# Patient Record
Sex: Female | Born: 1965
Health system: Southern US, Community
[De-identification: ages and names within clinical notes are randomized; demographics above are authoritative.]

## PROBLEM LIST (undated history)

## (undated) DIAGNOSIS — B9734 Human T-cell lymphotrophic virus, type II [HTLV-II] as the cause of diseases classified elsewhere: Secondary | ICD-10-CM

## (undated) DIAGNOSIS — B009 Herpesviral infection, unspecified: Secondary | ICD-10-CM

## (undated) DIAGNOSIS — R635 Abnormal weight gain: Secondary | ICD-10-CM

## (undated) DIAGNOSIS — S2239XA Fracture of one rib, unspecified side, initial encounter for closed fracture: Secondary | ICD-10-CM

## (undated) DIAGNOSIS — B20 Human immunodeficiency virus [HIV] disease: Secondary | ICD-10-CM

## (undated) DIAGNOSIS — R160 Hepatomegaly, not elsewhere classified: Secondary | ICD-10-CM

## (undated) DIAGNOSIS — Z972 Presence of dental prosthetic device (complete) (partial): Secondary | ICD-10-CM

## (undated) DIAGNOSIS — C4490 Unspecified malignant neoplasm of skin, unspecified: Secondary | ICD-10-CM

## (undated) HISTORY — DX: Abnormal weight gain: R63.5

## (undated) HISTORY — DX: Human immunodeficiency virus (HIV) disease: B20

## (undated) HISTORY — DX: Hepatomegaly, not elsewhere classified: R16.0

## (undated) HISTORY — PX: UPPER GI ENDOSCOPY: SHX6162

## (undated) HISTORY — DX: Unspecified malignant neoplasm of skin, unspecified: C44.90

## (undated) HISTORY — PX: COLONOSCOPY: SHX174

## (undated) HISTORY — DX: Human T-cell lymphotrophic virus, type II (HTLV-II) as the cause of diseases classified elsewhere: B97.34

---

## 1989-12-03 ENCOUNTER — Encounter (INDEPENDENT_AMBULATORY_CARE_PROVIDER_SITE_OTHER): Payer: Self-pay | Admitting: *Deleted

## 1989-12-03 LAB — CONVERTED CEMR LAB
CD4 Count: 396 microliters
CD4 T Cell Abs: 396

## 1998-03-04 ENCOUNTER — Encounter: Admission: RE | Admit: 1998-03-04 | Discharge: 1998-03-04 | Payer: Self-pay | Admitting: Infectious Diseases

## 1998-03-11 ENCOUNTER — Encounter: Admission: RE | Admit: 1998-03-11 | Discharge: 1998-03-11 | Payer: Self-pay | Admitting: Infectious Diseases

## 1998-04-25 ENCOUNTER — Encounter: Admission: RE | Admit: 1998-04-25 | Discharge: 1998-04-25 | Payer: Self-pay | Admitting: Infectious Diseases

## 1998-05-13 ENCOUNTER — Encounter: Admission: RE | Admit: 1998-05-13 | Discharge: 1998-05-13 | Payer: Self-pay | Admitting: Infectious Diseases

## 1998-06-11 ENCOUNTER — Encounter: Admission: RE | Admit: 1998-06-11 | Discharge: 1998-06-11 | Payer: Self-pay | Admitting: Infectious Diseases

## 1998-06-24 ENCOUNTER — Encounter: Admission: RE | Admit: 1998-06-24 | Discharge: 1998-06-24 | Payer: Self-pay | Admitting: Infectious Diseases

## 1998-08-05 ENCOUNTER — Ambulatory Visit (HOSPITAL_COMMUNITY): Admission: RE | Admit: 1998-08-05 | Discharge: 1998-08-05 | Payer: Self-pay | Admitting: Infectious Diseases

## 1998-08-05 ENCOUNTER — Encounter: Admission: RE | Admit: 1998-08-05 | Discharge: 1998-08-05 | Payer: Self-pay | Admitting: Infectious Diseases

## 1998-08-26 ENCOUNTER — Ambulatory Visit (HOSPITAL_COMMUNITY): Admission: RE | Admit: 1998-08-26 | Discharge: 1998-08-26 | Payer: Self-pay | Admitting: Infectious Diseases

## 1998-08-26 ENCOUNTER — Encounter: Payer: Self-pay | Admitting: Infectious Diseases

## 1998-08-26 ENCOUNTER — Encounter: Admission: RE | Admit: 1998-08-26 | Discharge: 1998-08-26 | Payer: Self-pay | Admitting: Infectious Diseases

## 1998-09-19 ENCOUNTER — Encounter: Admission: RE | Admit: 1998-09-19 | Discharge: 1998-09-19 | Payer: Self-pay | Admitting: Infectious Diseases

## 1998-10-09 ENCOUNTER — Encounter: Admission: RE | Admit: 1998-10-09 | Discharge: 1998-10-09 | Payer: Self-pay | Admitting: Infectious Diseases

## 1998-10-21 ENCOUNTER — Ambulatory Visit (HOSPITAL_COMMUNITY): Admission: RE | Admit: 1998-10-21 | Discharge: 1998-10-21 | Payer: Self-pay | Admitting: Pulmonary Disease

## 1998-10-21 ENCOUNTER — Encounter: Payer: Self-pay | Admitting: Pulmonary Disease

## 1998-11-14 ENCOUNTER — Emergency Department (HOSPITAL_COMMUNITY): Admission: EM | Admit: 1998-11-14 | Discharge: 1998-11-14 | Payer: Self-pay | Admitting: Emergency Medicine

## 1998-11-15 ENCOUNTER — Encounter: Payer: Self-pay | Admitting: Emergency Medicine

## 1998-11-16 ENCOUNTER — Inpatient Hospital Stay (HOSPITAL_COMMUNITY): Admission: AD | Admit: 1998-11-16 | Discharge: 1998-11-16 | Payer: Self-pay | Admitting: *Deleted

## 1998-11-18 ENCOUNTER — Emergency Department (HOSPITAL_COMMUNITY): Admission: EM | Admit: 1998-11-18 | Discharge: 1998-11-18 | Payer: Self-pay | Admitting: Emergency Medicine

## 1998-11-20 ENCOUNTER — Encounter: Admission: RE | Admit: 1998-11-20 | Discharge: 1998-11-20 | Payer: Self-pay | Admitting: Infectious Diseases

## 1999-01-20 ENCOUNTER — Encounter: Admission: RE | Admit: 1999-01-20 | Discharge: 1999-01-20 | Payer: Self-pay | Admitting: Infectious Diseases

## 1999-01-20 ENCOUNTER — Ambulatory Visit (HOSPITAL_COMMUNITY): Admission: RE | Admit: 1999-01-20 | Discharge: 1999-01-20 | Payer: Self-pay | Admitting: Infectious Diseases

## 1999-04-14 ENCOUNTER — Encounter: Admission: RE | Admit: 1999-04-14 | Discharge: 1999-04-14 | Payer: Self-pay | Admitting: Infectious Diseases

## 1999-04-14 ENCOUNTER — Ambulatory Visit (HOSPITAL_COMMUNITY): Admission: RE | Admit: 1999-04-14 | Discharge: 1999-04-14 | Payer: Self-pay | Admitting: Infectious Diseases

## 1999-05-14 ENCOUNTER — Encounter: Admission: RE | Admit: 1999-05-14 | Discharge: 1999-05-14 | Payer: Self-pay | Admitting: Infectious Diseases

## 1999-07-14 ENCOUNTER — Encounter: Admission: RE | Admit: 1999-07-14 | Discharge: 1999-07-14 | Payer: Self-pay | Admitting: Internal Medicine

## 1999-08-18 ENCOUNTER — Ambulatory Visit (HOSPITAL_COMMUNITY): Admission: RE | Admit: 1999-08-18 | Discharge: 1999-08-18 | Payer: Self-pay | Admitting: Internal Medicine

## 1999-08-18 ENCOUNTER — Encounter: Admission: RE | Admit: 1999-08-18 | Discharge: 1999-08-18 | Payer: Self-pay | Admitting: Internal Medicine

## 1999-10-01 ENCOUNTER — Encounter: Admission: RE | Admit: 1999-10-01 | Discharge: 1999-10-01 | Payer: Self-pay | Admitting: Infectious Diseases

## 1999-10-07 ENCOUNTER — Encounter: Payer: Self-pay | Admitting: Infectious Diseases

## 1999-10-07 ENCOUNTER — Ambulatory Visit (HOSPITAL_COMMUNITY): Admission: RE | Admit: 1999-10-07 | Discharge: 1999-10-07 | Payer: Self-pay | Admitting: Infectious Diseases

## 1999-11-12 ENCOUNTER — Ambulatory Visit (HOSPITAL_COMMUNITY): Admission: RE | Admit: 1999-11-12 | Discharge: 1999-11-12 | Payer: Self-pay | Admitting: Infectious Diseases

## 1999-11-12 ENCOUNTER — Encounter: Admission: RE | Admit: 1999-11-12 | Discharge: 1999-11-12 | Payer: Self-pay | Admitting: Infectious Diseases

## 1999-12-01 ENCOUNTER — Encounter: Admission: RE | Admit: 1999-12-01 | Discharge: 1999-12-01 | Payer: Self-pay | Admitting: Infectious Diseases

## 1999-12-02 ENCOUNTER — Encounter: Admission: RE | Admit: 1999-12-02 | Discharge: 1999-12-02 | Payer: Self-pay | Admitting: Obstetrics & Gynecology

## 2000-01-27 ENCOUNTER — Encounter: Admission: RE | Admit: 2000-01-27 | Discharge: 2000-01-27 | Payer: Self-pay | Admitting: Obstetrics & Gynecology

## 2000-01-28 ENCOUNTER — Encounter: Admission: RE | Admit: 2000-01-28 | Discharge: 2000-01-28 | Payer: Self-pay | Admitting: Infectious Diseases

## 2000-01-28 ENCOUNTER — Ambulatory Visit (HOSPITAL_COMMUNITY): Admission: RE | Admit: 2000-01-28 | Discharge: 2000-01-28 | Payer: Self-pay | Admitting: Infectious Diseases

## 2000-02-09 ENCOUNTER — Encounter: Admission: RE | Admit: 2000-02-09 | Discharge: 2000-02-09 | Payer: Self-pay | Admitting: Infectious Diseases

## 2000-03-31 ENCOUNTER — Encounter: Admission: RE | Admit: 2000-03-31 | Discharge: 2000-03-31 | Payer: Self-pay | Admitting: Infectious Diseases

## 2000-04-17 ENCOUNTER — Inpatient Hospital Stay (HOSPITAL_COMMUNITY): Admission: EM | Admit: 2000-04-17 | Discharge: 2000-04-20 | Payer: Self-pay

## 2000-04-17 ENCOUNTER — Encounter: Payer: Self-pay | Admitting: Surgery

## 2000-04-17 ENCOUNTER — Encounter: Payer: Self-pay | Admitting: Emergency Medicine

## 2000-04-18 ENCOUNTER — Encounter: Payer: Self-pay | Admitting: Surgery

## 2000-06-29 ENCOUNTER — Encounter: Admission: RE | Admit: 2000-06-29 | Discharge: 2000-06-29 | Payer: Self-pay | Admitting: Infectious Diseases

## 2000-06-29 ENCOUNTER — Ambulatory Visit (HOSPITAL_COMMUNITY): Admission: RE | Admit: 2000-06-29 | Discharge: 2000-06-29 | Payer: Self-pay | Admitting: Infectious Diseases

## 2000-07-07 ENCOUNTER — Encounter: Admission: RE | Admit: 2000-07-07 | Discharge: 2000-07-07 | Payer: Self-pay | Admitting: Infectious Diseases

## 2000-08-18 ENCOUNTER — Encounter: Admission: RE | Admit: 2000-08-18 | Discharge: 2000-08-18 | Payer: Self-pay | Admitting: Infectious Diseases

## 2000-08-18 ENCOUNTER — Ambulatory Visit (HOSPITAL_COMMUNITY): Admission: RE | Admit: 2000-08-18 | Discharge: 2000-08-18 | Payer: Self-pay | Admitting: Infectious Diseases

## 2000-09-06 ENCOUNTER — Encounter: Admission: RE | Admit: 2000-09-06 | Discharge: 2000-09-06 | Payer: Self-pay | Admitting: Infectious Diseases

## 2000-10-15 ENCOUNTER — Ambulatory Visit (HOSPITAL_COMMUNITY): Admission: RE | Admit: 2000-10-15 | Discharge: 2000-10-15 | Payer: Self-pay | Admitting: Surgery

## 2000-10-15 ENCOUNTER — Encounter (INDEPENDENT_AMBULATORY_CARE_PROVIDER_SITE_OTHER): Payer: Self-pay | Admitting: Specialist

## 2001-05-02 ENCOUNTER — Encounter: Admission: RE | Admit: 2001-05-02 | Discharge: 2001-05-02 | Payer: Self-pay | Admitting: Infectious Diseases

## 2001-05-16 ENCOUNTER — Encounter: Admission: RE | Admit: 2001-05-16 | Discharge: 2001-05-16 | Payer: Self-pay | Admitting: Infectious Diseases

## 2001-08-09 ENCOUNTER — Encounter: Admission: RE | Admit: 2001-08-09 | Discharge: 2001-08-09 | Payer: Self-pay | Admitting: Infectious Diseases

## 2001-08-09 ENCOUNTER — Ambulatory Visit (HOSPITAL_COMMUNITY): Admission: RE | Admit: 2001-08-09 | Discharge: 2001-08-09 | Payer: Self-pay | Admitting: Infectious Diseases

## 2001-08-29 ENCOUNTER — Encounter: Admission: RE | Admit: 2001-08-29 | Discharge: 2001-08-29 | Payer: Self-pay | Admitting: Infectious Diseases

## 2001-11-16 ENCOUNTER — Encounter (INDEPENDENT_AMBULATORY_CARE_PROVIDER_SITE_OTHER): Payer: Self-pay | Admitting: *Deleted

## 2001-11-16 ENCOUNTER — Ambulatory Visit (HOSPITAL_BASED_OUTPATIENT_CLINIC_OR_DEPARTMENT_OTHER): Admission: RE | Admit: 2001-11-16 | Discharge: 2001-11-16 | Payer: Self-pay | Admitting: Plastic Surgery

## 2002-01-23 ENCOUNTER — Encounter: Admission: RE | Admit: 2002-01-23 | Discharge: 2002-01-23 | Payer: Self-pay | Admitting: Infectious Diseases

## 2002-01-23 ENCOUNTER — Ambulatory Visit (HOSPITAL_COMMUNITY): Admission: RE | Admit: 2002-01-23 | Discharge: 2002-01-23 | Payer: Self-pay | Admitting: Infectious Diseases

## 2002-06-07 ENCOUNTER — Encounter: Payer: Self-pay | Admitting: Infectious Diseases

## 2002-06-07 ENCOUNTER — Ambulatory Visit (HOSPITAL_COMMUNITY): Admission: RE | Admit: 2002-06-07 | Discharge: 2002-06-07 | Payer: Self-pay | Admitting: Infectious Diseases

## 2002-07-24 ENCOUNTER — Ambulatory Visit (HOSPITAL_COMMUNITY): Admission: RE | Admit: 2002-07-24 | Discharge: 2002-07-24 | Payer: Self-pay | Admitting: Infectious Diseases

## 2002-07-24 ENCOUNTER — Encounter: Admission: RE | Admit: 2002-07-24 | Discharge: 2002-07-24 | Payer: Self-pay | Admitting: Infectious Diseases

## 2002-08-28 ENCOUNTER — Encounter: Admission: RE | Admit: 2002-08-28 | Discharge: 2002-08-28 | Payer: Self-pay | Admitting: Infectious Diseases

## 2002-09-08 ENCOUNTER — Encounter: Payer: Self-pay | Admitting: Infectious Diseases

## 2002-09-08 ENCOUNTER — Ambulatory Visit (HOSPITAL_COMMUNITY): Admission: RE | Admit: 2002-09-08 | Discharge: 2002-09-08 | Payer: Self-pay | Admitting: Infectious Diseases

## 2002-10-23 ENCOUNTER — Encounter: Admission: RE | Admit: 2002-10-23 | Discharge: 2002-10-23 | Payer: Self-pay | Admitting: Infectious Diseases

## 2002-11-15 ENCOUNTER — Ambulatory Visit (HOSPITAL_COMMUNITY): Admission: RE | Admit: 2002-11-15 | Discharge: 2002-11-15 | Payer: Self-pay | Admitting: Infectious Diseases

## 2002-11-15 ENCOUNTER — Encounter: Admission: RE | Admit: 2002-11-15 | Discharge: 2002-11-15 | Payer: Self-pay | Admitting: Infectious Diseases

## 2002-12-04 ENCOUNTER — Encounter: Admission: RE | Admit: 2002-12-04 | Discharge: 2002-12-04 | Payer: Self-pay | Admitting: Infectious Diseases

## 2002-12-21 ENCOUNTER — Encounter: Admission: RE | Admit: 2002-12-21 | Discharge: 2002-12-21 | Payer: Self-pay | Admitting: Infectious Diseases

## 2002-12-22 ENCOUNTER — Encounter: Admission: RE | Admit: 2002-12-22 | Discharge: 2002-12-22 | Payer: Self-pay | Admitting: Infectious Diseases

## 2003-01-12 ENCOUNTER — Ambulatory Visit (HOSPITAL_COMMUNITY): Admission: RE | Admit: 2003-01-12 | Discharge: 2003-01-12 | Payer: Self-pay | Admitting: Infectious Diseases

## 2003-01-12 ENCOUNTER — Encounter: Payer: Self-pay | Admitting: Infectious Diseases

## 2003-01-15 ENCOUNTER — Encounter: Admission: RE | Admit: 2003-01-15 | Discharge: 2003-01-15 | Payer: Self-pay | Admitting: Infectious Diseases

## 2003-01-16 ENCOUNTER — Encounter: Payer: Self-pay | Admitting: Infectious Diseases

## 2003-01-16 ENCOUNTER — Ambulatory Visit (HOSPITAL_COMMUNITY): Admission: RE | Admit: 2003-01-16 | Discharge: 2003-01-16 | Payer: Self-pay | Admitting: Infectious Diseases

## 2003-02-23 ENCOUNTER — Encounter: Admission: RE | Admit: 2003-02-23 | Discharge: 2003-02-23 | Payer: Self-pay | Admitting: Infectious Diseases

## 2003-02-23 ENCOUNTER — Encounter: Payer: Self-pay | Admitting: Infectious Diseases

## 2003-02-23 ENCOUNTER — Encounter (INDEPENDENT_AMBULATORY_CARE_PROVIDER_SITE_OTHER): Payer: Self-pay | Admitting: Infectious Diseases

## 2003-03-12 ENCOUNTER — Encounter: Admission: RE | Admit: 2003-03-12 | Discharge: 2003-03-12 | Payer: Self-pay | Admitting: Infectious Diseases

## 2003-05-28 ENCOUNTER — Encounter: Admission: RE | Admit: 2003-05-28 | Discharge: 2003-05-28 | Payer: Self-pay | Admitting: Internal Medicine

## 2003-05-28 ENCOUNTER — Ambulatory Visit (HOSPITAL_COMMUNITY): Admission: RE | Admit: 2003-05-28 | Discharge: 2003-05-28 | Payer: Self-pay | Admitting: Infectious Diseases

## 2003-05-28 ENCOUNTER — Encounter (INDEPENDENT_AMBULATORY_CARE_PROVIDER_SITE_OTHER): Payer: Self-pay | Admitting: Infectious Diseases

## 2003-06-11 ENCOUNTER — Encounter: Admission: RE | Admit: 2003-06-11 | Discharge: 2003-06-11 | Payer: Self-pay | Admitting: Infectious Diseases

## 2003-10-12 ENCOUNTER — Ambulatory Visit (HOSPITAL_COMMUNITY): Admission: RE | Admit: 2003-10-12 | Discharge: 2003-10-12 | Payer: Self-pay | Admitting: Internal Medicine

## 2003-10-16 ENCOUNTER — Encounter (INDEPENDENT_AMBULATORY_CARE_PROVIDER_SITE_OTHER): Payer: Self-pay | Admitting: Infectious Diseases

## 2003-10-16 ENCOUNTER — Ambulatory Visit (HOSPITAL_COMMUNITY): Admission: RE | Admit: 2003-10-16 | Discharge: 2003-10-16 | Payer: Self-pay | Admitting: Infectious Diseases

## 2003-10-16 ENCOUNTER — Encounter: Admission: RE | Admit: 2003-10-16 | Discharge: 2003-10-16 | Payer: Self-pay | Admitting: Internal Medicine

## 2003-12-25 ENCOUNTER — Other Ambulatory Visit: Admission: RE | Admit: 2003-12-25 | Discharge: 2003-12-25 | Payer: Self-pay | Admitting: Obstetrics and Gynecology

## 2003-12-27 ENCOUNTER — Encounter: Admission: RE | Admit: 2003-12-27 | Discharge: 2003-12-27 | Payer: Self-pay | Admitting: Otolaryngology

## 2004-01-14 ENCOUNTER — Ambulatory Visit (HOSPITAL_COMMUNITY): Admission: RE | Admit: 2004-01-14 | Discharge: 2004-01-14 | Payer: Self-pay | Admitting: Infectious Diseases

## 2004-01-14 ENCOUNTER — Encounter: Admission: RE | Admit: 2004-01-14 | Discharge: 2004-01-14 | Payer: Self-pay | Admitting: Infectious Diseases

## 2004-02-20 ENCOUNTER — Encounter: Admission: RE | Admit: 2004-02-20 | Discharge: 2004-02-20 | Payer: Self-pay | Admitting: Infectious Diseases

## 2004-03-04 ENCOUNTER — Encounter (INDEPENDENT_AMBULATORY_CARE_PROVIDER_SITE_OTHER): Payer: Self-pay | Admitting: Specialist

## 2004-03-04 ENCOUNTER — Observation Stay (HOSPITAL_COMMUNITY): Admission: RE | Admit: 2004-03-04 | Discharge: 2004-03-05 | Payer: Self-pay | Admitting: Obstetrics and Gynecology

## 2004-03-06 ENCOUNTER — Inpatient Hospital Stay (HOSPITAL_COMMUNITY): Admission: AD | Admit: 2004-03-06 | Discharge: 2004-03-10 | Payer: Self-pay | Admitting: Obstetrics and Gynecology

## 2004-03-17 ENCOUNTER — Ambulatory Visit (HOSPITAL_COMMUNITY): Admission: RE | Admit: 2004-03-17 | Discharge: 2004-03-17 | Payer: Self-pay | Admitting: Obstetrics and Gynecology

## 2004-04-22 ENCOUNTER — Ambulatory Visit (HOSPITAL_COMMUNITY): Admission: RE | Admit: 2004-04-22 | Discharge: 2004-04-22 | Payer: Self-pay | Admitting: Obstetrics and Gynecology

## 2005-01-12 ENCOUNTER — Encounter (INDEPENDENT_AMBULATORY_CARE_PROVIDER_SITE_OTHER): Payer: Self-pay | Admitting: *Deleted

## 2005-01-12 ENCOUNTER — Ambulatory Visit: Payer: Self-pay | Admitting: Infectious Diseases

## 2005-01-12 ENCOUNTER — Ambulatory Visit (HOSPITAL_COMMUNITY): Admission: RE | Admit: 2005-01-12 | Discharge: 2005-01-12 | Payer: Self-pay | Admitting: Infectious Diseases

## 2005-01-12 LAB — CONVERTED CEMR LAB: HIV 1 RNA Quant: 49 copies/mL

## 2005-01-30 ENCOUNTER — Ambulatory Visit: Payer: Self-pay | Admitting: Infectious Diseases

## 2005-02-25 ENCOUNTER — Ambulatory Visit: Payer: Self-pay | Admitting: Infectious Diseases

## 2005-03-19 IMAGING — US US PELVIS COMPLETE MODIFY
1 series · 14 of 25 positions shown · non-contrast
Comparison: none

CLINICAL DATA: Postoperative for a total vaginal hysterectomy.  Evaluate for pelvic hematoma.
TRANSABDOMINAL PELVIC ULTRASOUND:
Images of the pelvis were obtained both transabdominal and translabially.  Endovaginal assessment was not possible due to the patient?s recent total vaginal hysterectomy.
A normal vaginal cuff is identified both transabdominally and translabially.  Just superior to the vaginal cuff is a soft tissue density which measures 4.0 x 2.2 x 3.1 cm.  Because of the imaging used, the internal characteristics of this area cannot be well visualized, but there is increased through transmission suggesting that this has a fluid component and given the patient?s clinical situation likely represents a hematoma.
No free fluid is identified in the pelvis or with evaluation in the paracolic gutters.
IMPRESSION
Soft tissue abnormality directly superior to the vaginal cuff felt most likely to represent hematoma.  Please see above report for full discussion.

[Series 1: unknown · 0.32mm/px · 14 of 45 slices shown]
[im 1/45]
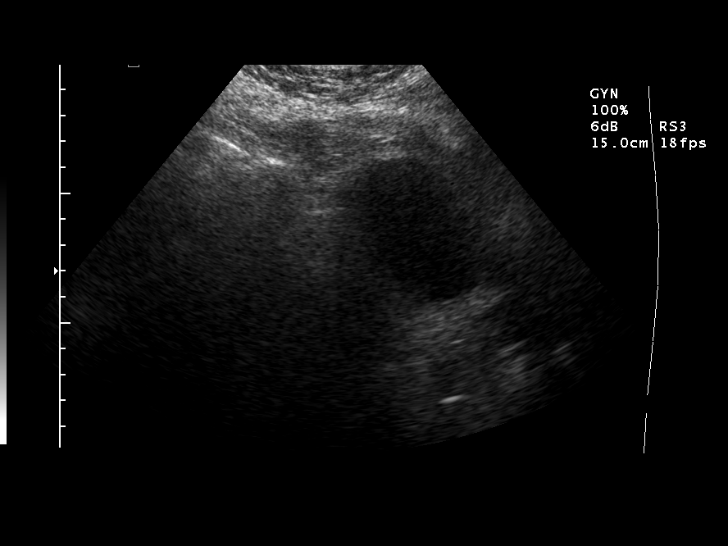
[im 4/45]
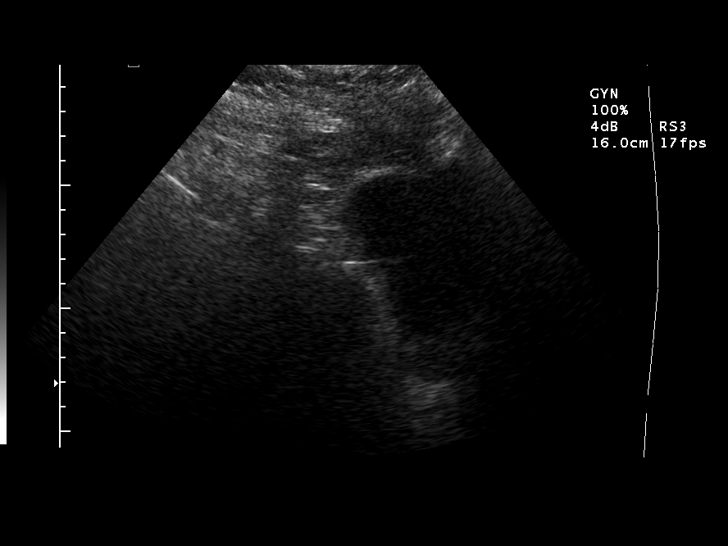
[im 8/45]
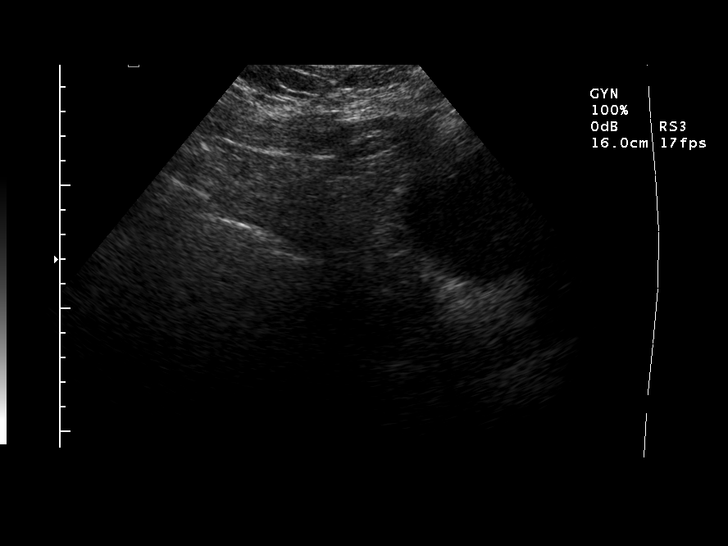
[im 12/45]
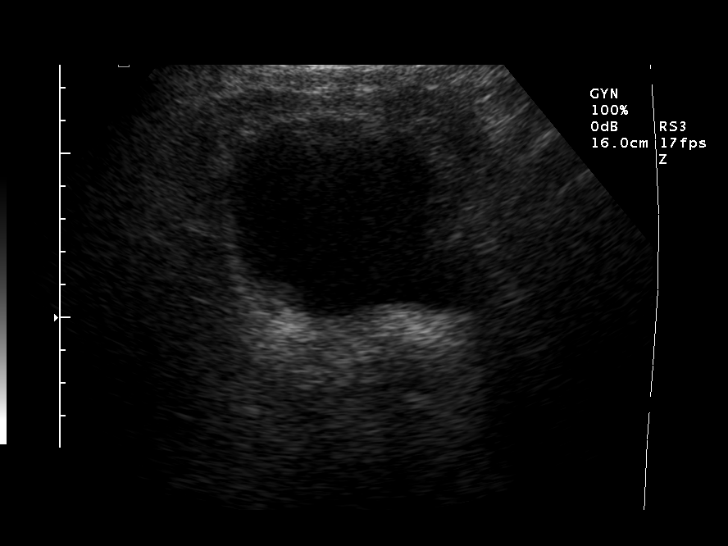
[im 15/45]
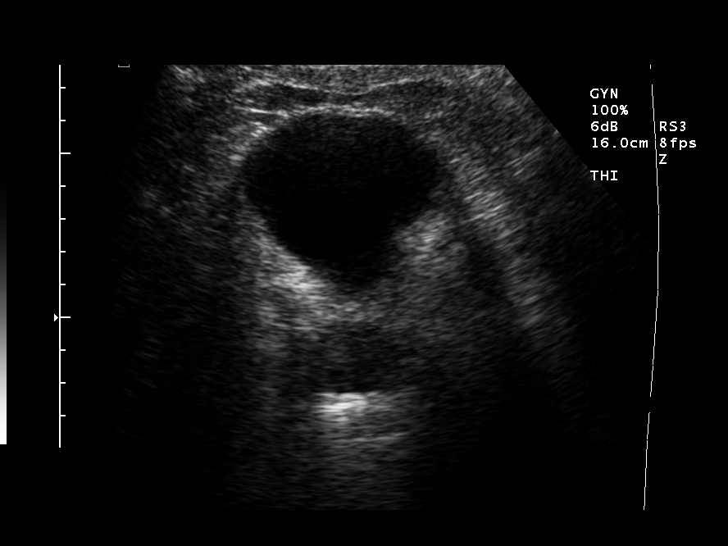
[im 17/45]
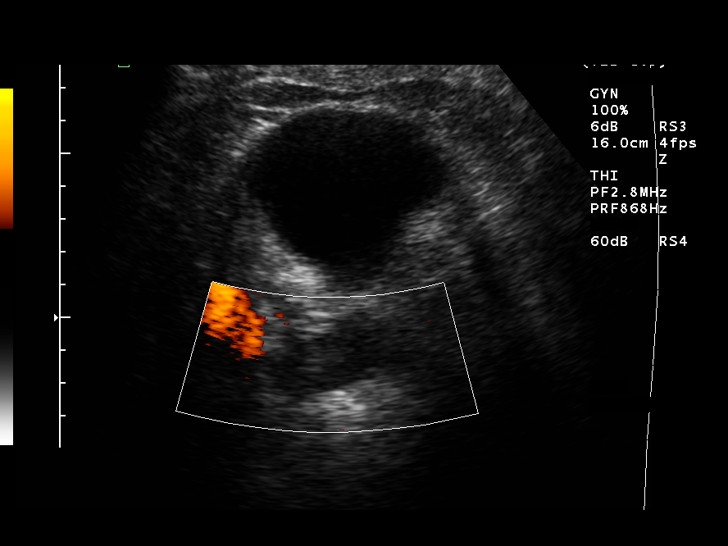
[im 21/45]
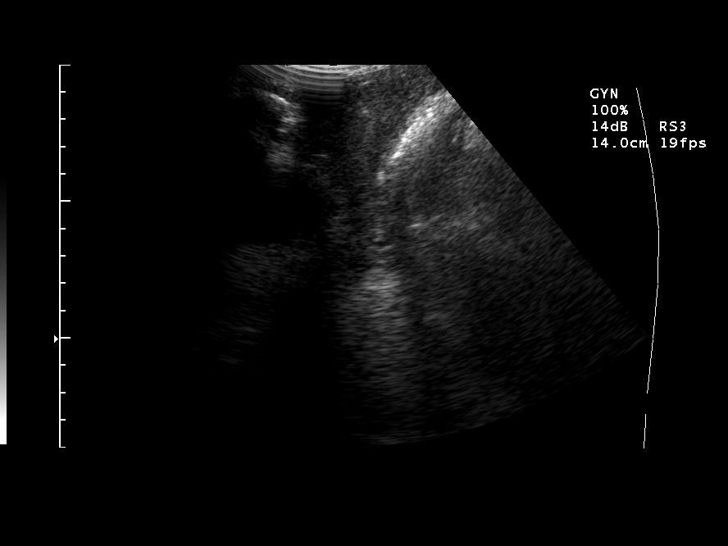
[im 24/45]
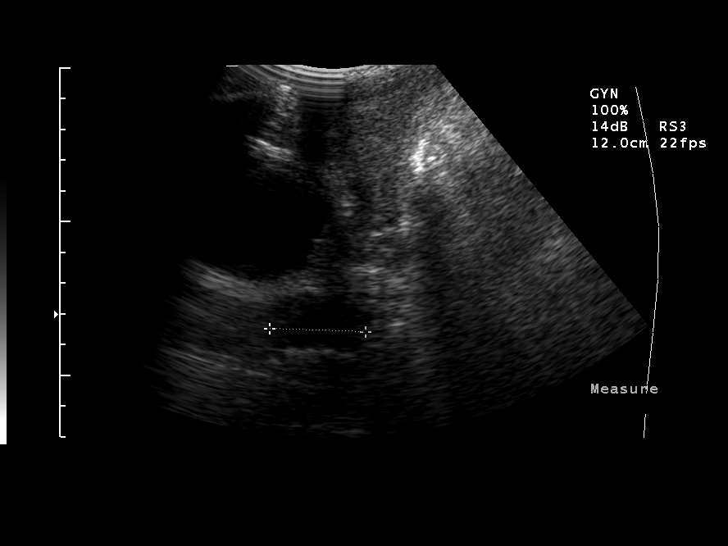
[im 28/45]
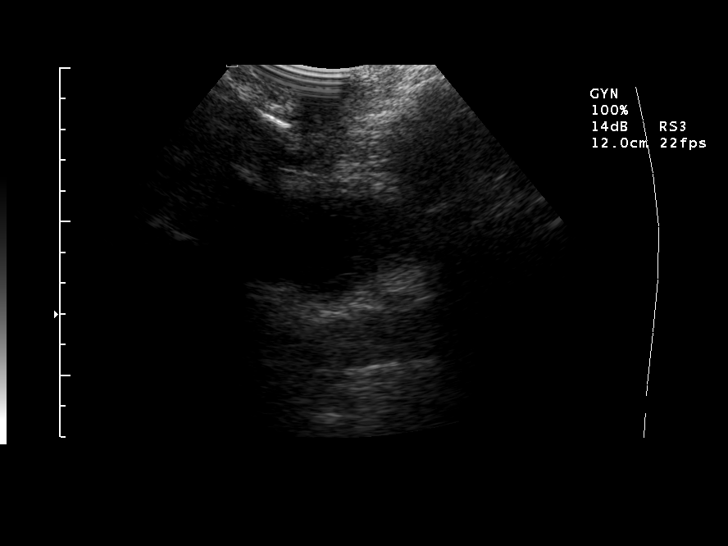
[im 30/45]
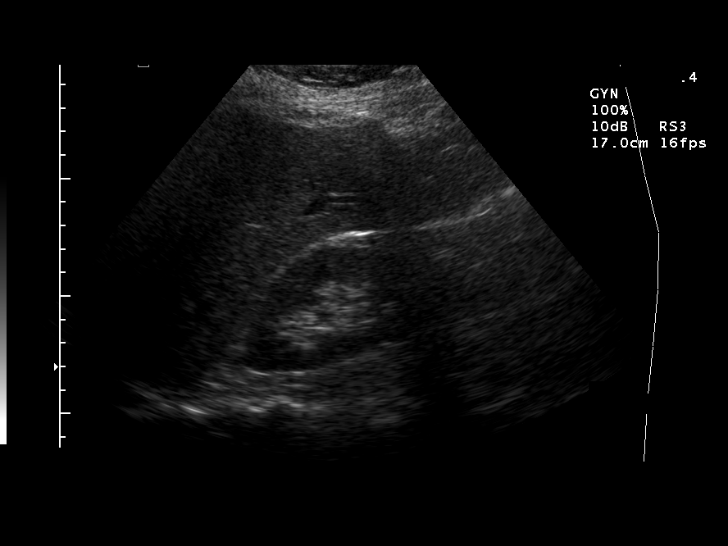
[im 34/45]
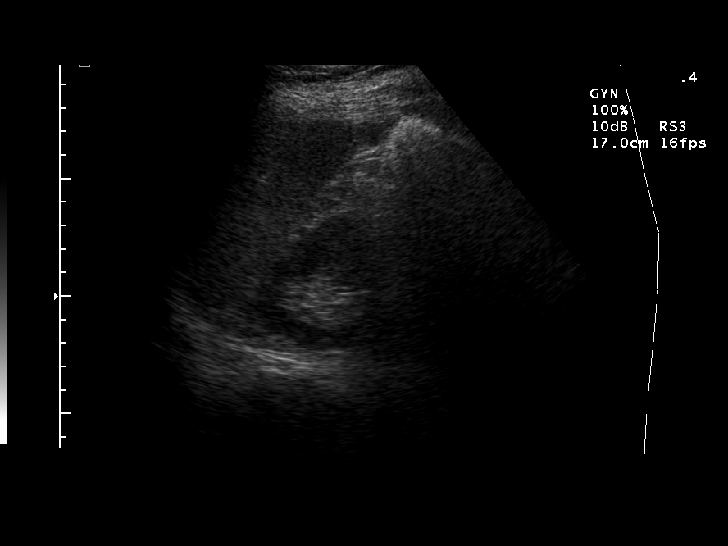
[im 37/45]
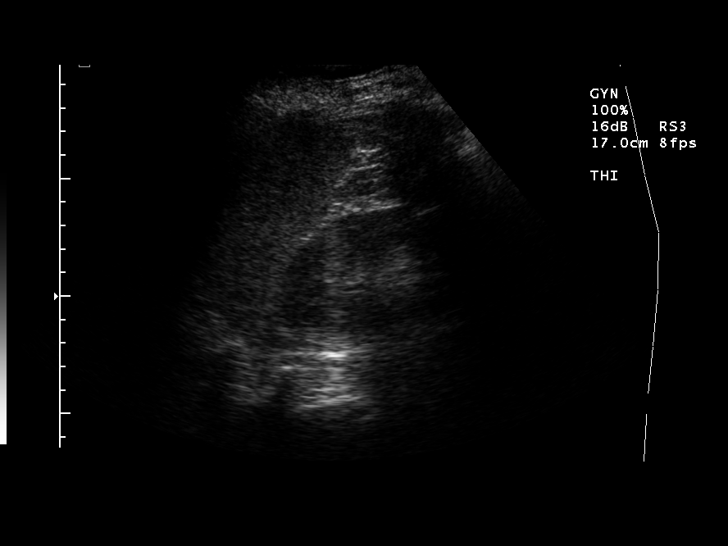
[im 41/45]
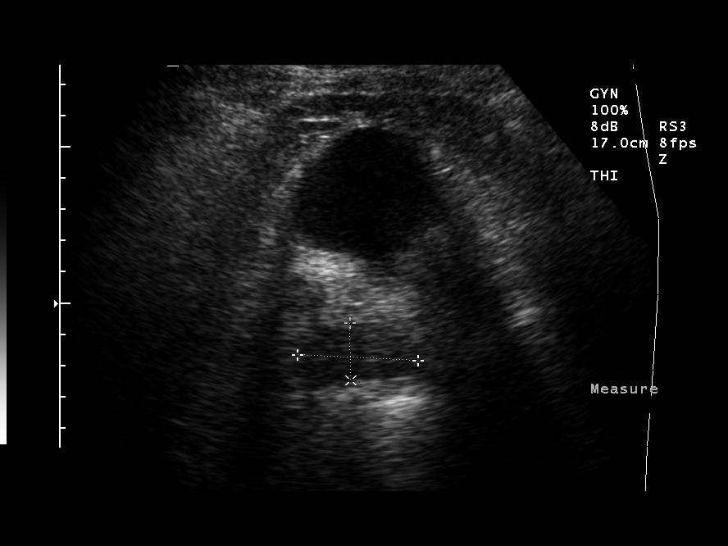
[im 45/45]
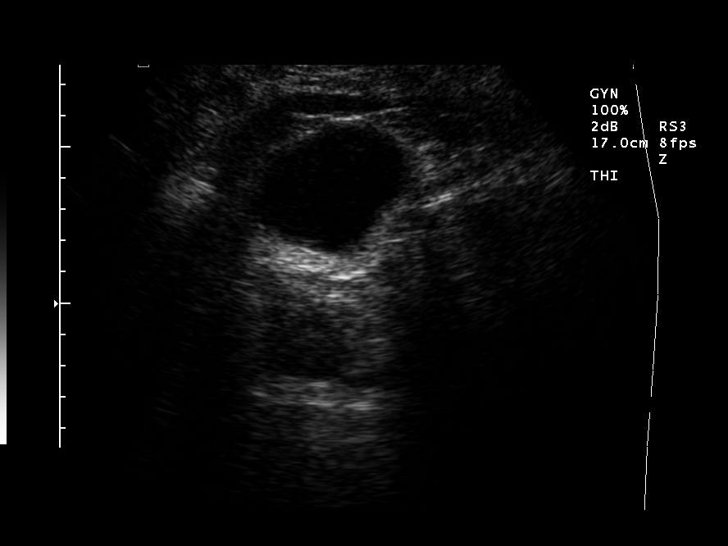

[14 of 25 positions shown; findings below may reference images not displayed]

## 2005-03-20 IMAGING — CT CT PELVIS W/ CM
1 of 2 series · 15 of 32 positions shown, 19 images · IV contrast ([ID] [id number]  & [ID] [id number])
Comparison: none

CLINICAL DATA: Postop hysterectomy.  Fever and pain.
TECHNIQUE: Multidetector, helical scanning through the abdomen and pelvis was performed following oral contrast administration and during infusion of 100 cc of Omnipaque 300, IV.
CT SCAN OF THE ABDOMEN WITH INTRAVENOUS CONTRAST:
There is bibasilar atelectasis and tiny right pleural effusion.
The liver, gallbladder, adrenal glands, spleen and pancreas are unremarkable in appearance.  There is no hydronephrosis.  In the lower pole of the right kidney there is an 8 mm low density lesion that is too small to characterize, but likely represents a cyst.  The kidneys are otherwise normal in appearance.  Visualized bowel loops have a normal appearance.
IMPRESSION
Negative CT scan of the abdomen.  Bibasilar atelectasis.
CT SCAN OF THE PELVIS WITH INTRAVENOUS CONTRAST:
There is subcutaneous air in the umbilical region, as well as the left lower pelvis consistent with the recent surgery.  There are also punctate areas of free intraperitoneal air within the pelvis.  This is consistent with the patient?s recent surgery.  The patient is status post hysterectomy.  In the pelvis there is increased high attenuation material which is likely complex fluid.  No air is seen with the fluid to suggest an abscess.  There is no enlarged adenopathy.  Visualized bowel loops have an unremarkable appearance.
Postoperative changes are noted in the pelvis with punctate areas of free intraperitoneal air and subcutaneous air.  
There is probable complex fluid in the pelvis.  This may represent some postoperative hematoma, but a plegmon collection cannot entirely be excluded.  There is no air within the complex fluid to suggest that this represents an abscess.

[Series 2: — · axial · 0.66mm/px · z∈[-432,-82]mm · 15 of 115 slices shown, 19 images]
[im 9/115  soft-tissue]
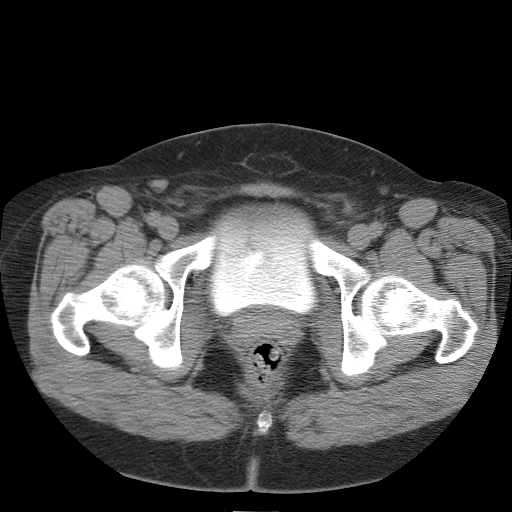
[im 9/115  bone]
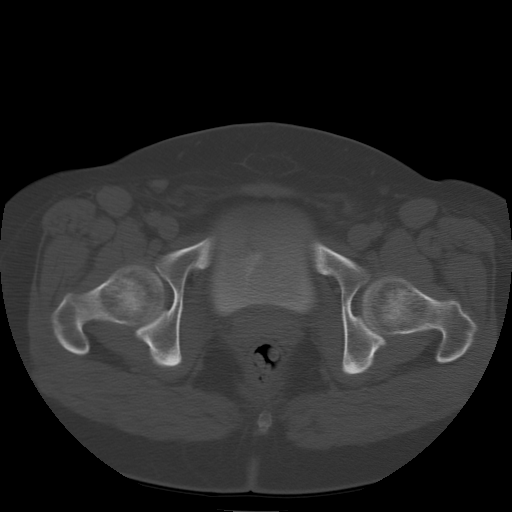
[im 17/115  soft-tissue]
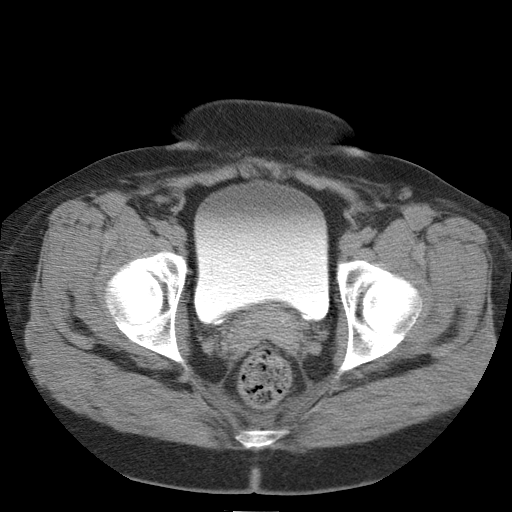
[im 26/115  soft-tissue]
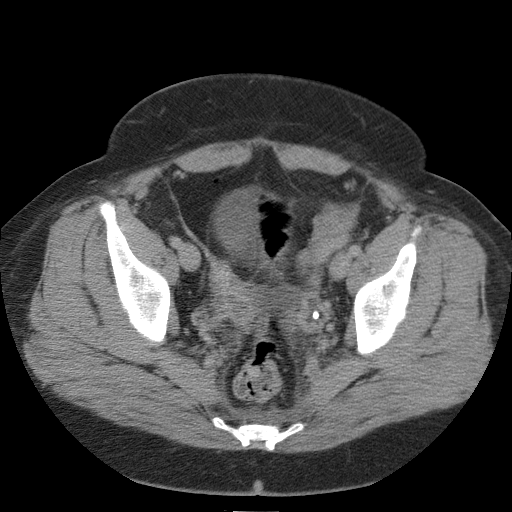
[im 34/115  soft-tissue]
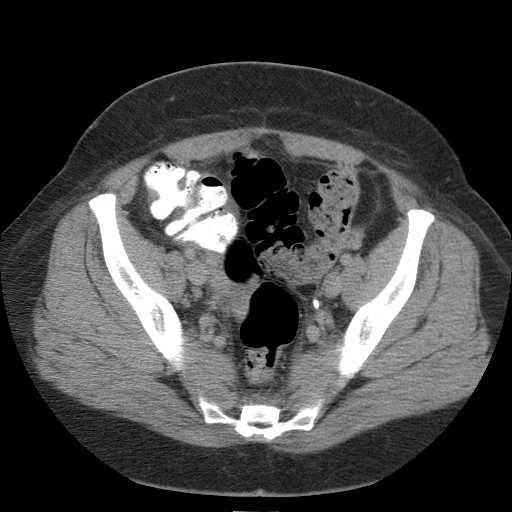
[im 43/115  soft-tissue]
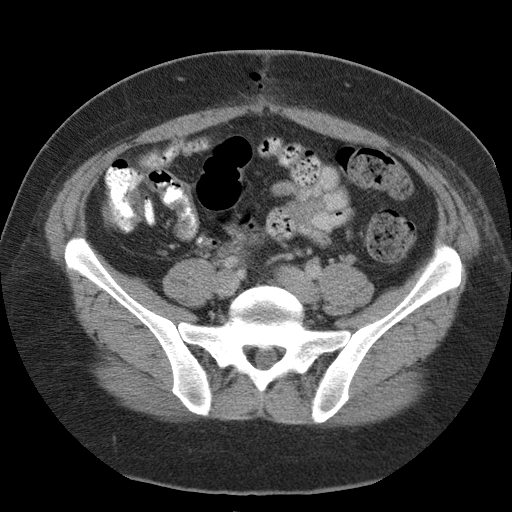
[im 51/115  soft-tissue]
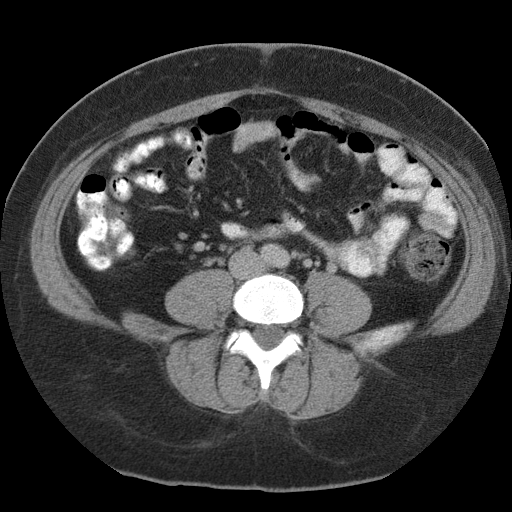
[im 60/115  soft-tissue]
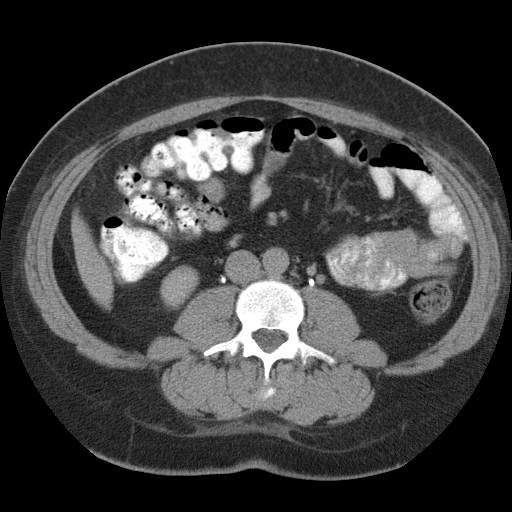
[im 68/115  soft-tissue]
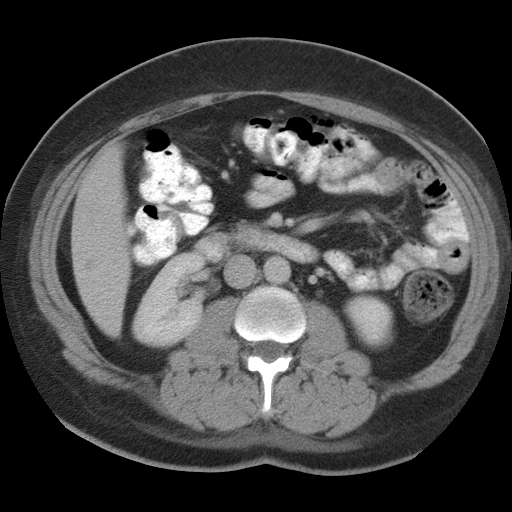
[im 77/115  soft-tissue]
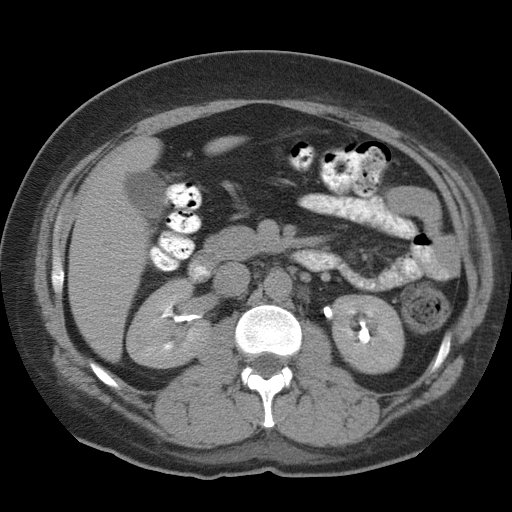
[im 77/115  bone]
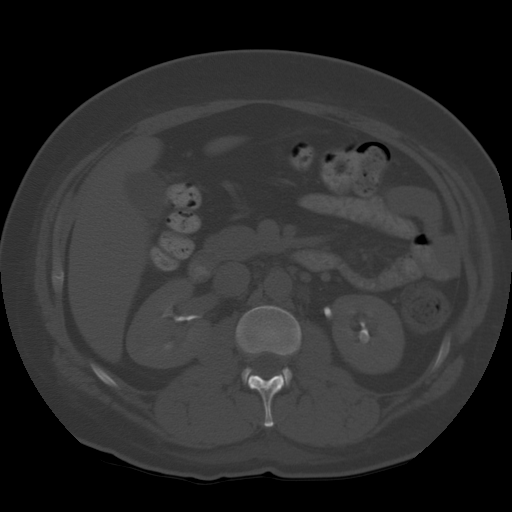
[im 85/115  soft-tissue]
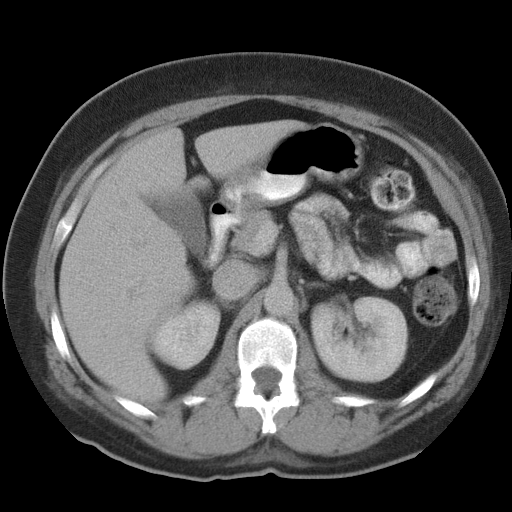
[im 93/115  soft-tissue]
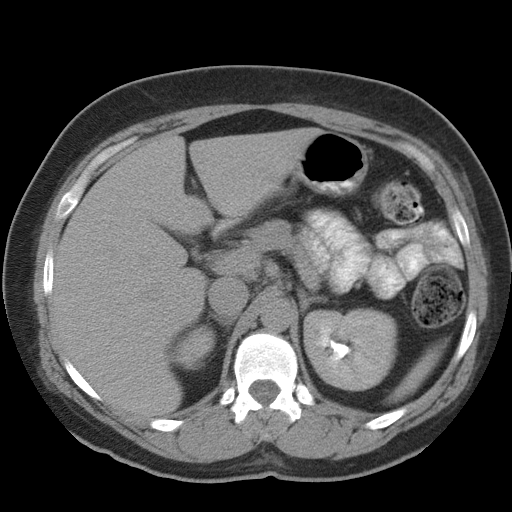
[im 98/115  lung]
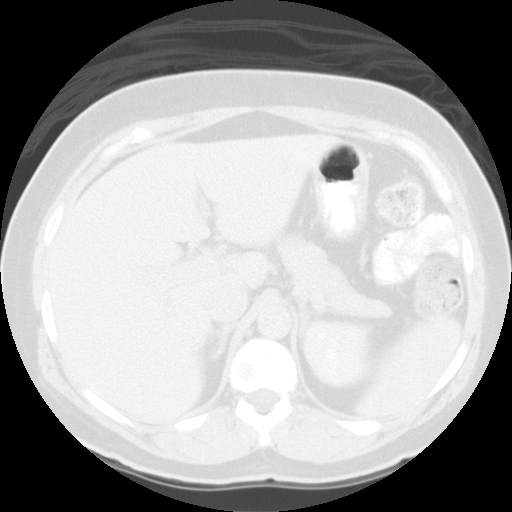
[im 102/115  soft-tissue]
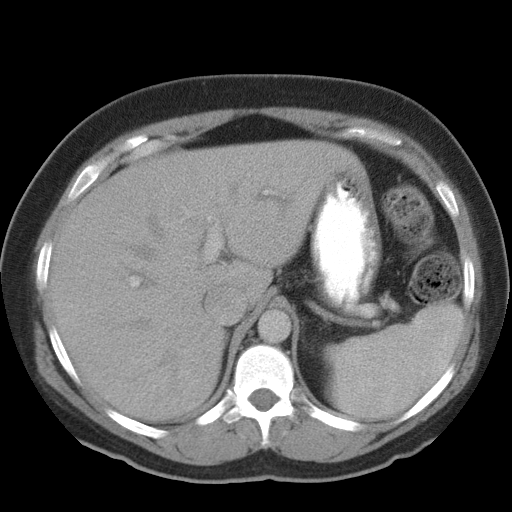
[im 102/115  lung]
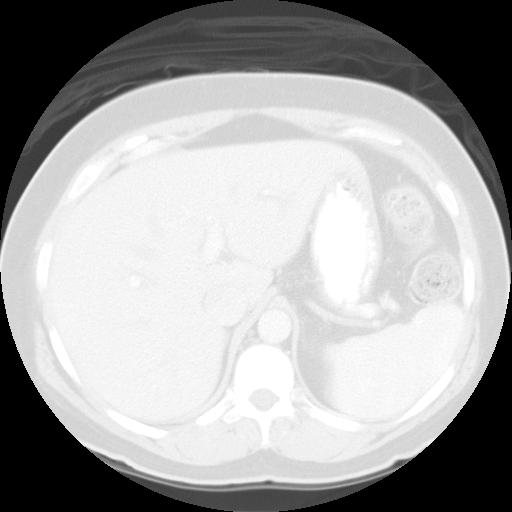
[im 106/115  lung]
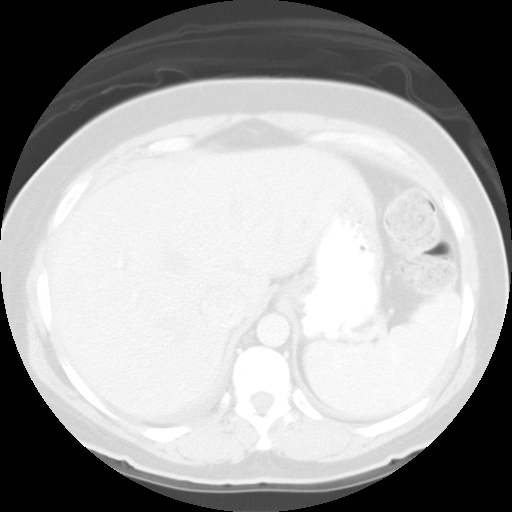
[im 110/115  soft-tissue]
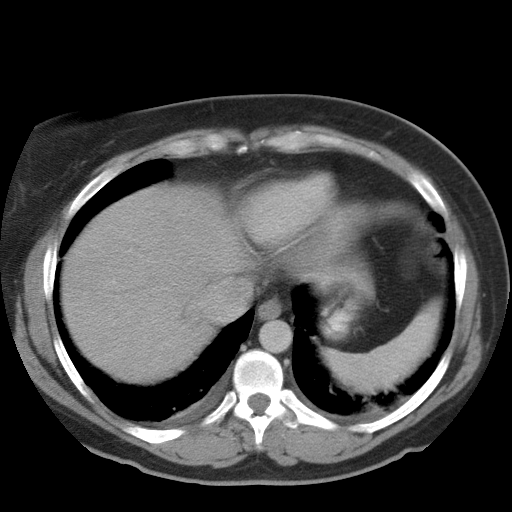
[im 110/115  lung]
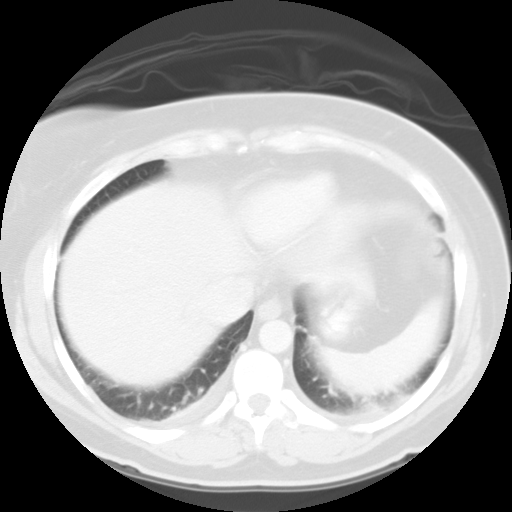

[15 of 32 positions shown; findings below may reference images not displayed]

## 2005-05-05 IMAGING — US US TRANSVAGINAL NON-OB
1 series · 14 of 16 positions shown · non-contrast
Comparison: none

CLINICAL DATA: Post-op hematoma.  
 TRANSVAGINAL PELVIC ULTRASOUND:
 Endovaginal scanning documents interval resolution of the small hematoma identified at the vaginal cuff on the previous study. 
 The right ovary measures 3.2 x 2.7 x 2.3 cm and contains a 15 mm dominant follicle.  The left ovary could not be identified.  
 No free fluid is identified in the peritoneal cavity.  
 IMPRESSION
 Interval resolution of the small vaginal cuff hematoma seen on the previous study of 03/17/04.
 No free fluid in the pelvis.

[Series 1: unknown · 0.15mm/px · 14 of 16 slices shown]
[im 1/16]
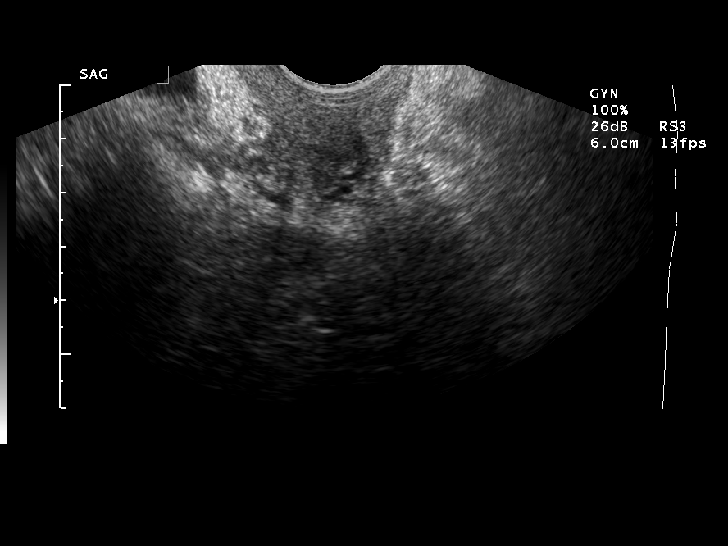
[im 2/16]
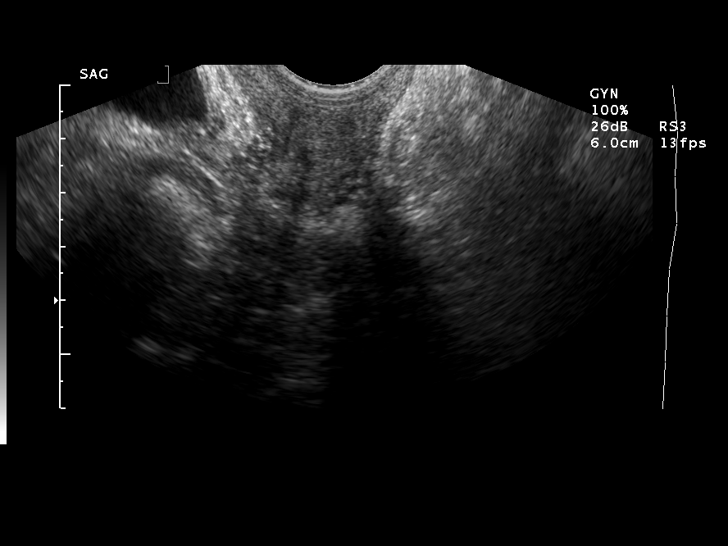
[im 3/16]
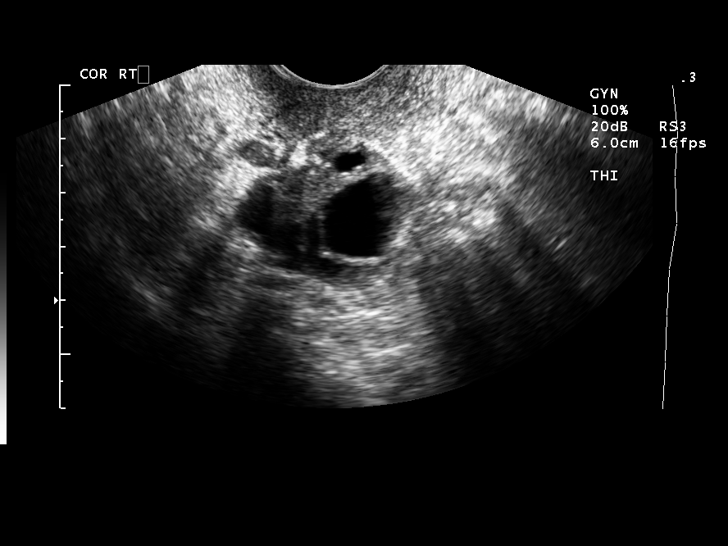
[im 5/16]
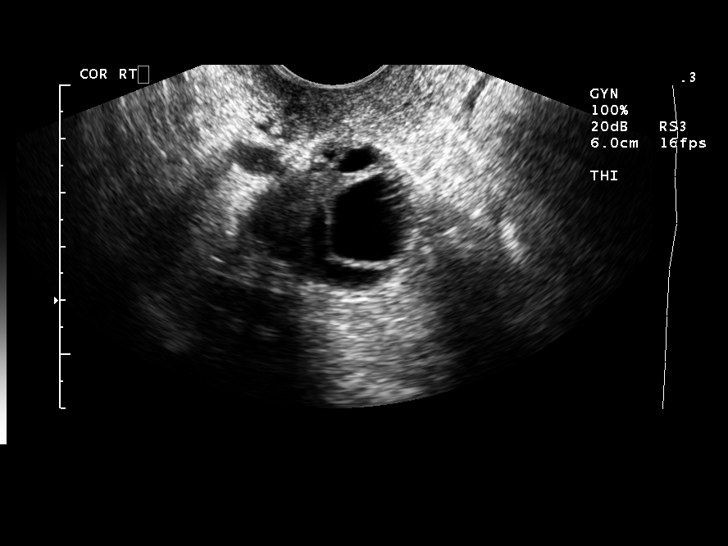
[im 6/16]
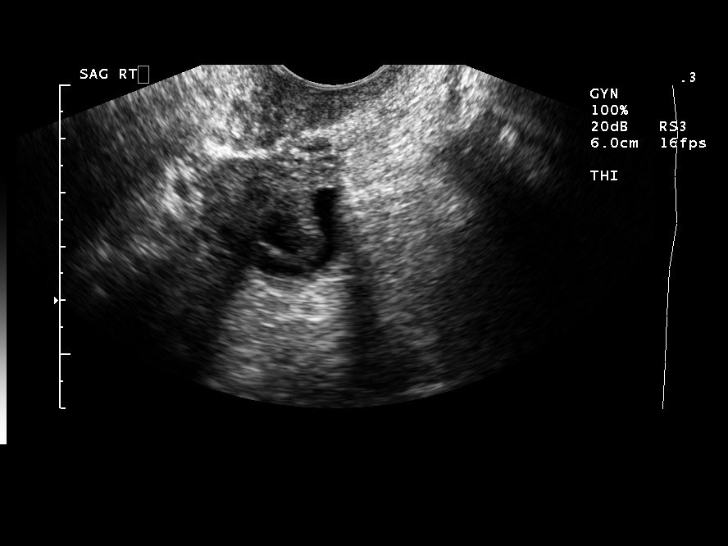
[im 7/16]
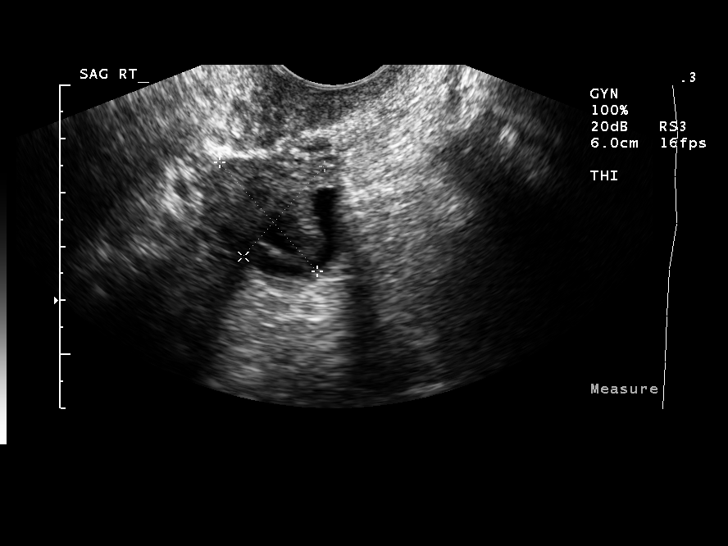
[im 8/16]
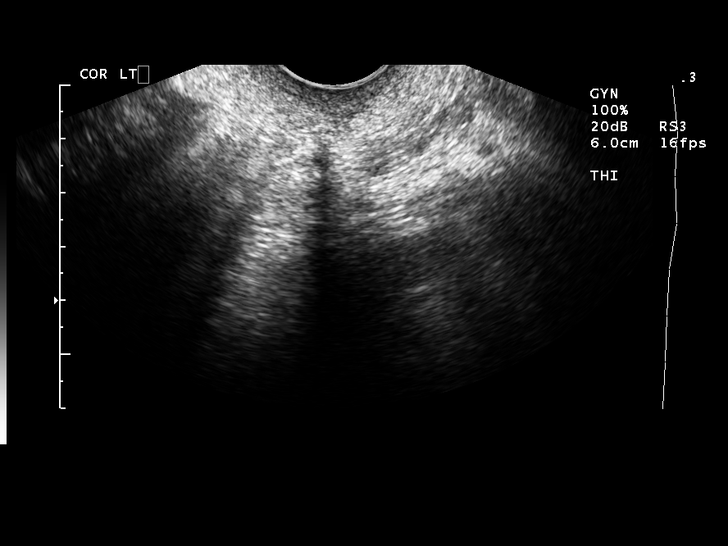
[im 9/16]
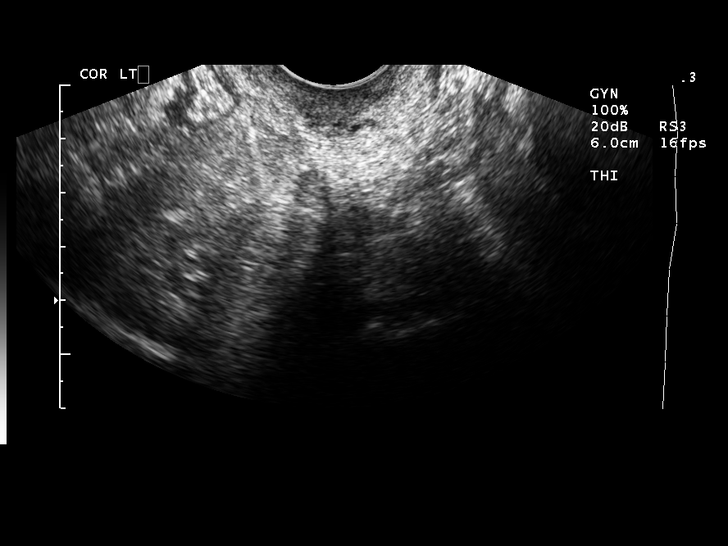
[im 10/16]
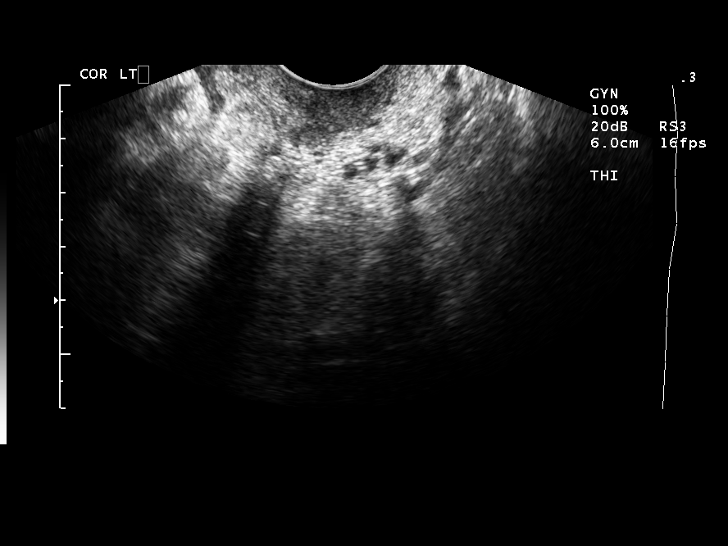
[im 11/16]
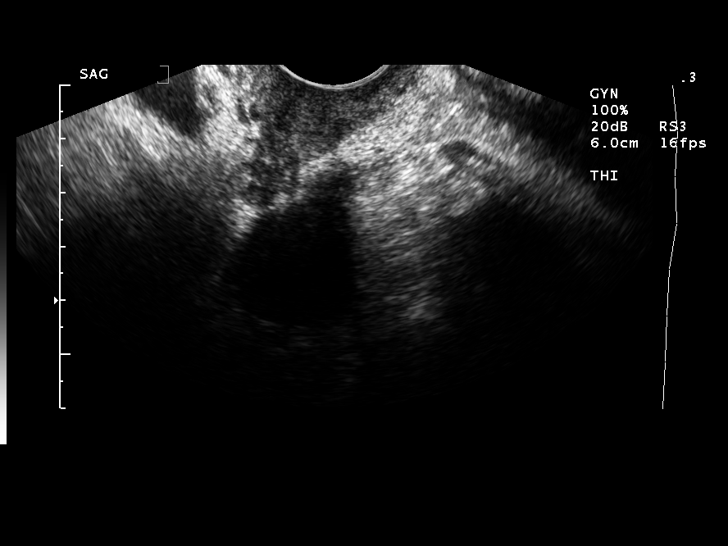
[im 13/16]
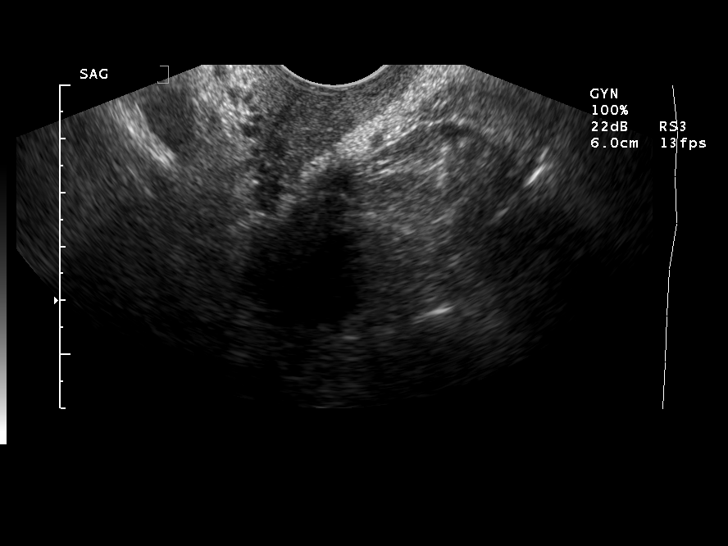
[im 14/16]
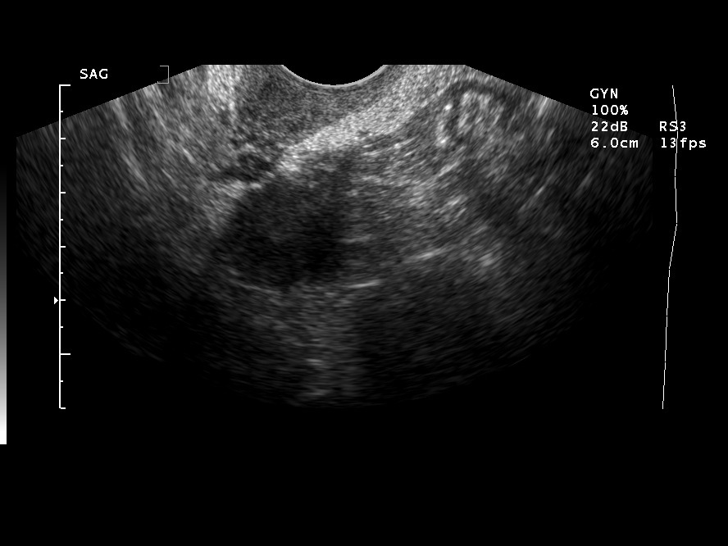
[im 15/16]
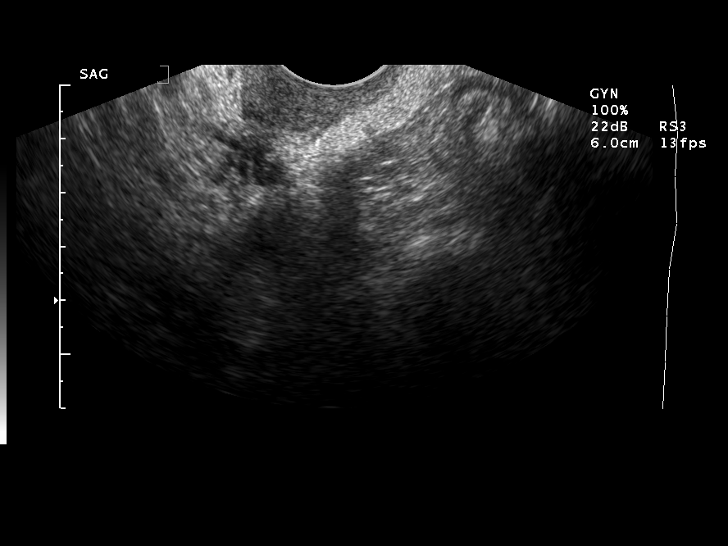
[im 16/16]
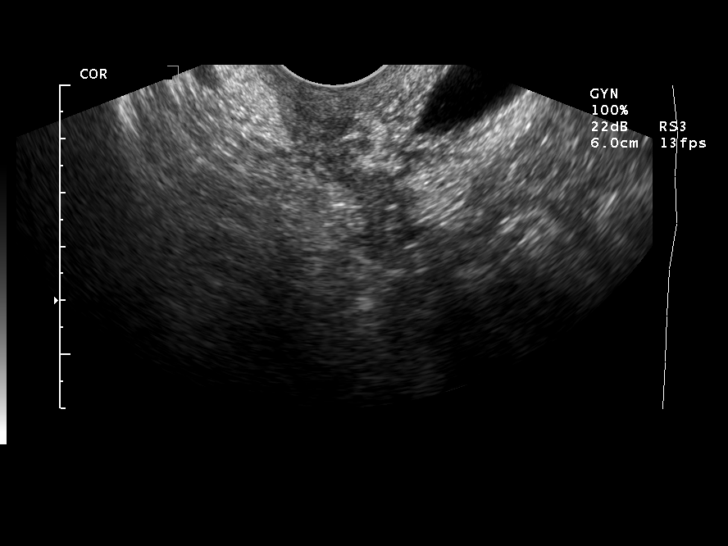

[14 of 16 positions shown; findings below may reference images not displayed]

## 2005-06-21 ENCOUNTER — Encounter: Admission: RE | Admit: 2005-06-21 | Discharge: 2005-06-21 | Payer: Self-pay | Admitting: Orthopaedic Surgery

## 2005-11-02 HISTORY — PX: TOTAL VAGINAL HYSTERECTOMY: SHX2548

## 2005-11-20 ENCOUNTER — Ambulatory Visit: Payer: Self-pay | Admitting: Gastroenterology

## 2005-11-24 ENCOUNTER — Encounter: Payer: Self-pay | Admitting: Infectious Disease

## 2005-11-24 ENCOUNTER — Other Ambulatory Visit: Admission: RE | Admit: 2005-11-24 | Discharge: 2005-11-24 | Payer: Self-pay | Admitting: Obstetrics and Gynecology

## 2005-11-25 ENCOUNTER — Ambulatory Visit: Payer: Self-pay | Admitting: Gastroenterology

## 2005-12-14 ENCOUNTER — Ambulatory Visit (HOSPITAL_COMMUNITY): Admission: RE | Admit: 2005-12-14 | Discharge: 2005-12-14 | Payer: Self-pay | Admitting: Obstetrics and Gynecology

## 2006-04-12 ENCOUNTER — Encounter: Payer: Self-pay | Admitting: Infectious Disease

## 2006-04-12 ENCOUNTER — Encounter (INDEPENDENT_AMBULATORY_CARE_PROVIDER_SITE_OTHER): Payer: Self-pay | Admitting: *Deleted

## 2006-04-12 ENCOUNTER — Ambulatory Visit: Payer: Self-pay | Admitting: Infectious Diseases

## 2006-04-12 ENCOUNTER — Encounter: Admission: RE | Admit: 2006-04-12 | Discharge: 2006-04-12 | Payer: Self-pay | Admitting: Infectious Diseases

## 2006-04-12 LAB — CONVERTED CEMR LAB: CD4 Count: 1110 microliters

## 2006-05-18 ENCOUNTER — Encounter (INDEPENDENT_AMBULATORY_CARE_PROVIDER_SITE_OTHER): Payer: Self-pay | Admitting: *Deleted

## 2006-05-18 ENCOUNTER — Ambulatory Visit (HOSPITAL_COMMUNITY): Admission: RE | Admit: 2006-05-18 | Discharge: 2006-05-18 | Payer: Self-pay | Admitting: Obstetrics and Gynecology

## 2006-05-31 ENCOUNTER — Ambulatory Visit: Payer: Self-pay | Admitting: Infectious Diseases

## 2006-05-31 ENCOUNTER — Encounter: Payer: Self-pay | Admitting: Infectious Disease

## 2006-06-23 ENCOUNTER — Encounter: Payer: Self-pay | Admitting: Infectious Disease

## 2006-08-18 ENCOUNTER — Encounter: Admission: RE | Admit: 2006-08-18 | Discharge: 2006-08-18 | Payer: Self-pay | Admitting: Infectious Diseases

## 2006-08-18 ENCOUNTER — Ambulatory Visit: Payer: Self-pay | Admitting: Infectious Diseases

## 2006-08-18 ENCOUNTER — Encounter: Payer: Self-pay | Admitting: Infectious Disease

## 2006-08-18 ENCOUNTER — Encounter (INDEPENDENT_AMBULATORY_CARE_PROVIDER_SITE_OTHER): Payer: Self-pay | Admitting: *Deleted

## 2006-08-18 LAB — CONVERTED CEMR LAB
ALT: 11 units/L (ref 0–40)
AST: 16 units/L (ref 0–37)
Albumin: 4.2 g/dL (ref 3.5–5.2)
Alkaline Phosphatase: 80 units/L (ref 39–117)
BUN: 11 mg/dL (ref 6–23)
Basophils Absolute: 0 10*3/uL (ref 0.0–0.1)
Basophils percent auto: 1 % (ref 0–1)
CO2: 23 meq/L (ref 19–32)
Calcium: 8.9 mg/dL (ref 8.4–10.5)
Chloride: 109 meq/L (ref 96–112)
Creatinine, Ser: 0.85 mg/dL (ref 0.40–1.20)
Eosinophils Absolute: 0.1 cells/mcL (ref 0.0–0.7)
Eosinophils Relative: 2 % (ref 0–5)
Glucose, Bld: 95 mg/dL (ref 70–99)
HCT: 40.3 % (ref 36.0–46.0)
HIV 1 RNA Quant: 58 copies/mL — ABNORMAL HIGH (ref <50)
HIV-1 RNA Quant, Log: 1.76 — ABNORMAL HIGH (ref <1.70)
Hemoglobin: 13.4 g/dL (ref 12.0–15.0)
Leukocyte count, blood: 5.2 10*9/L (ref 4.0–10.5)
Lymphocytes Relative: 40 % (ref 12–46)
Lymphs Abs: 2.1 10*3/uL (ref 0.7–3.3)
MCHC: 33.3 g/dL (ref 30.0–36.0)
MCV: 96.9 fL (ref 78.0–100.0)
Monocytes Absolute: 0.4 10*3/uL (ref 0.2–0.7)
Monocytes Relative: 7 % (ref 3–11)
Neutro Abs: 2.6 10*3/uL (ref 1.7–7.7)
Neutrophils Relative %: 50 % (ref 43–77)
Platelets: 276 10*3/uL (ref 150–400)
Potassium: 4.4 meq/L (ref 3.5–5.3)
RBC: 4.16 M/uL (ref 3.87–5.11)
RDW: 12.1 % (ref 11.5–14.0)
Sodium: 138 meq/L (ref 135–145)
Total Bilirubin: 0.4 mg/dL (ref 0.3–1.2)
Total Protein: 6.7 g/dL (ref 6.0–8.3)

## 2006-09-01 DIAGNOSIS — B20 Human immunodeficiency virus [HIV] disease: Secondary | ICD-10-CM

## 2006-09-01 DIAGNOSIS — B079 Viral wart, unspecified: Secondary | ICD-10-CM | POA: Insufficient documentation

## 2006-09-01 DIAGNOSIS — A630 Anogenital (venereal) warts: Secondary | ICD-10-CM

## 2006-09-01 DIAGNOSIS — J328 Other chronic sinusitis: Secondary | ICD-10-CM | POA: Insufficient documentation

## 2006-09-01 DIAGNOSIS — J029 Acute pharyngitis, unspecified: Secondary | ICD-10-CM

## 2006-09-01 DIAGNOSIS — N949 Unspecified condition associated with female genital organs and menstrual cycle: Secondary | ICD-10-CM

## 2006-09-01 DIAGNOSIS — J209 Acute bronchitis, unspecified: Secondary | ICD-10-CM | POA: Insufficient documentation

## 2006-10-05 ENCOUNTER — Ambulatory Visit: Payer: Self-pay | Admitting: Infectious Diseases

## 2006-10-05 ENCOUNTER — Encounter: Payer: Self-pay | Admitting: Infectious Disease

## 2006-12-27 ENCOUNTER — Encounter (INDEPENDENT_AMBULATORY_CARE_PROVIDER_SITE_OTHER): Payer: Self-pay | Admitting: *Deleted

## 2006-12-27 LAB — CONVERTED CEMR LAB

## 2007-01-05 ENCOUNTER — Telehealth (INDEPENDENT_AMBULATORY_CARE_PROVIDER_SITE_OTHER): Payer: Self-pay | Admitting: Infectious Diseases

## 2007-01-09 ENCOUNTER — Encounter (INDEPENDENT_AMBULATORY_CARE_PROVIDER_SITE_OTHER): Payer: Self-pay | Admitting: *Deleted

## 2007-02-14 ENCOUNTER — Encounter: Admission: RE | Admit: 2007-02-14 | Discharge: 2007-02-14 | Payer: Self-pay | Admitting: Infectious Diseases

## 2007-02-14 ENCOUNTER — Ambulatory Visit: Payer: Self-pay | Admitting: Infectious Diseases

## 2007-02-14 ENCOUNTER — Encounter: Payer: Self-pay | Admitting: Infectious Disease

## 2007-02-14 LAB — CONVERTED CEMR LAB
Alkaline Phosphatase: 98 units/L (ref 39–117)
BUN: 10 mg/dL (ref 6–23)
Bilirubin Urine: NEGATIVE
CO2: 23 meq/L (ref 19–32)
Cholesterol: 147 mg/dL (ref 0–200)
Creatinine, Ser: 0.8 mg/dL (ref 0.40–1.20)
Eosinophils Absolute: 0.2 10*3/uL (ref 0.0–0.7)
Eosinophils Relative: 2 % (ref 0–5)
Glucose, Bld: 87 mg/dL (ref 70–99)
HCT: 41.5 % (ref 36.0–46.0)
HDL: 47 mg/dL (ref >39)
HIV 1 RNA Quant: 117 copies/mL — ABNORMAL HIGH (ref <50)
HIV-1 RNA Quant, Log: 2.07 — ABNORMAL HIGH (ref <1.70)
Hemoglobin: 13.5 g/dL (ref 12.0–15.0)
Ketones, ur: NEGATIVE mg/dL
LDL Cholesterol: 82 mg/dL (ref 0–99)
Lymphocytes Relative: 22 % (ref 12–46)
Lymphs Abs: 2.3 10*3/uL (ref 0.7–3.3)
MCV: 97.9 fL (ref 78.0–100.0)
Monocytes Absolute: 0.7 10*3/uL (ref 0.2–0.7)
Monocytes Relative: 7 % (ref 3–11)
Platelets: 297 10*3/uL (ref 150–400)
Protein, ur: NEGATIVE mg/dL
Total Bilirubin: 0.5 mg/dL (ref 0.3–1.2)
Total Protein: 7.1 g/dL (ref 6.0–8.3)
Triglycerides: 88 mg/dL (ref <150)
Urine Glucose: NEGATIVE mg/dL
VLDL: 18 mg/dL (ref 0–40)
WBC: 10.2 10*3/uL (ref 4.0–10.5)

## 2007-05-18 ENCOUNTER — Telehealth: Payer: Self-pay | Admitting: Internal Medicine

## 2007-05-20 ENCOUNTER — Encounter: Admission: RE | Admit: 2007-05-20 | Discharge: 2007-05-20 | Payer: Self-pay | Admitting: Orthopedic Surgery

## 2007-06-20 ENCOUNTER — Ambulatory Visit: Payer: Self-pay | Admitting: Infectious Disease

## 2007-06-20 ENCOUNTER — Encounter: Admission: RE | Admit: 2007-06-20 | Discharge: 2007-06-20 | Payer: Self-pay | Admitting: Internal Medicine

## 2007-06-20 DIAGNOSIS — E881 Lipodystrophy, not elsewhere classified: Secondary | ICD-10-CM | POA: Insufficient documentation

## 2007-06-20 DIAGNOSIS — R300 Dysuria: Secondary | ICD-10-CM | POA: Insufficient documentation

## 2007-06-20 LAB — CONVERTED CEMR LAB: HIV-1 RNA Quant, Log: 1.76 — ABNORMAL HIGH (ref <1.70)

## 2007-06-21 ENCOUNTER — Encounter: Payer: Self-pay | Admitting: Infectious Disease

## 2007-06-21 LAB — CONVERTED CEMR LAB
Albumin: 4.4 g/dL (ref 3.5–5.2)
BUN: 17 mg/dL (ref 6–23)
Bilirubin Urine: NEGATIVE
Calcium: 8.4 mg/dL (ref 8.4–10.5)
Chloride: 105 meq/L (ref 96–112)
Cholesterol: 141 mg/dL (ref 0–200)
Creatinine, Ser: 0.84 mg/dL (ref 0.40–1.20)
Glucose, Bld: 93 mg/dL (ref 70–99)
HDL: 38 mg/dL — ABNORMAL LOW (ref >39)
Hemoglobin: 14.1 g/dL (ref 12.0–15.0)
Ketones, ur: NEGATIVE mg/dL
Lymphs Abs: 2.7 10*3/uL (ref 0.7–3.3)
MCV: 97 fL (ref 78.0–100.0)
Monocytes Absolute: 0.6 10*3/uL (ref 0.2–0.7)
Monocytes Relative: 7 % (ref 3–11)
Neutro Abs: 5.1 10*3/uL (ref 1.7–7.7)
Neutrophils Relative %: 59 % (ref 43–77)
Nitrite: NEGATIVE
Potassium: 4.5 meq/L (ref 3.5–5.3)
RBC / HPF: NONE SEEN (ref <3)
RBC: 4.38 M/uL (ref 3.87–5.11)
Specific Gravity, Urine: 1.015 (ref 1.005–1.03)
Triglycerides: 178 mg/dL — ABNORMAL HIGH (ref <150)
Urine Glucose: NEGATIVE mg/dL
WBC: 8.7 10*3/uL (ref 4.0–10.5)
pH: 7 (ref 5.0–8.0)

## 2007-07-11 ENCOUNTER — Telehealth: Payer: Self-pay | Admitting: Infectious Disease

## 2007-07-27 ENCOUNTER — Encounter: Payer: Self-pay | Admitting: Infectious Disease

## 2007-08-30 ENCOUNTER — Ambulatory Visit (HOSPITAL_COMMUNITY): Admission: RE | Admit: 2007-08-30 | Discharge: 2007-08-31 | Payer: Self-pay | Admitting: Neurological Surgery

## 2007-11-01 ENCOUNTER — Encounter: Payer: Self-pay | Admitting: Infectious Disease

## 2007-11-21 ENCOUNTER — Telehealth: Payer: Self-pay | Admitting: Infectious Disease

## 2007-12-05 ENCOUNTER — Ambulatory Visit: Payer: Self-pay | Admitting: Infectious Disease

## 2007-12-05 ENCOUNTER — Encounter: Admission: RE | Admit: 2007-12-05 | Discharge: 2007-12-05 | Payer: Self-pay | Admitting: Infectious Disease

## 2007-12-05 ENCOUNTER — Telehealth: Payer: Self-pay

## 2007-12-05 LAB — CONVERTED CEMR LAB
ALT: 16 units/L (ref 0–35)
Alkaline Phosphatase: 92 units/L (ref 39–117)
Basophils Absolute: 0.1 10*3/uL (ref 0.0–0.1)
CO2: 23 meq/L (ref 19–32)
Eosinophils Absolute: 0.2 10*3/uL (ref 0.0–0.7)
Eosinophils Relative: 2 % (ref 0–5)
HCT: 39.6 % (ref 36.0–46.0)
HIV 1 RNA Quant: 507 copies/mL — ABNORMAL HIGH (ref <50)
HIV-1 RNA Quant, Log: 2.71 — ABNORMAL HIGH (ref <1.70)
LDL Cholesterol: 74 mg/dL (ref 0–99)
Lymphocytes Relative: 31 % (ref 12–46)
Platelets: 314 10*3/uL (ref 150–400)
RDW: 12.5 % (ref 11.5–15.5)
Sodium: 139 meq/L (ref 135–145)
Total Bilirubin: 0.4 mg/dL (ref 0.3–1.2)
Total Protein: 7.1 g/dL (ref 6.0–8.3)
VLDL: 24 mg/dL (ref 0–40)

## 2007-12-19 ENCOUNTER — Ambulatory Visit: Payer: Self-pay | Admitting: Infectious Disease

## 2007-12-19 DIAGNOSIS — R5383 Other fatigue: Secondary | ICD-10-CM

## 2007-12-19 DIAGNOSIS — R0609 Other forms of dyspnea: Secondary | ICD-10-CM

## 2007-12-19 DIAGNOSIS — R0989 Other specified symptoms and signs involving the circulatory and respiratory systems: Secondary | ICD-10-CM

## 2007-12-19 DIAGNOSIS — R5381 Other malaise: Secondary | ICD-10-CM

## 2007-12-19 DIAGNOSIS — F32A Depression, unspecified: Secondary | ICD-10-CM | POA: Insufficient documentation

## 2007-12-19 DIAGNOSIS — F329 Major depressive disorder, single episode, unspecified: Secondary | ICD-10-CM

## 2007-12-19 LAB — CONVERTED CEMR LAB: TSH: 1.104 microintl units/mL (ref 0.350–5.50)

## 2007-12-27 ENCOUNTER — Ambulatory Visit (HOSPITAL_COMMUNITY): Admission: RE | Admit: 2007-12-27 | Discharge: 2007-12-27 | Payer: Self-pay | Admitting: Infectious Disease

## 2008-01-24 ENCOUNTER — Encounter: Payer: Self-pay | Admitting: Infectious Disease

## 2008-02-02 ENCOUNTER — Encounter: Admission: RE | Admit: 2008-02-02 | Discharge: 2008-02-02 | Payer: Self-pay | Admitting: Infectious Disease

## 2008-02-02 ENCOUNTER — Ambulatory Visit: Payer: Self-pay | Admitting: Infectious Disease

## 2008-02-02 LAB — CONVERTED CEMR LAB
ALT: 13 units/L (ref 0–35)
CO2: 23 meq/L (ref 19–32)
Calcium: 9 mg/dL (ref 8.4–10.5)
Chloride: 108 meq/L (ref 96–112)
Cholesterol: 151 mg/dL (ref 0–200)
HIV-1 RNA Quant, Log: 1.85 — ABNORMAL HIGH (ref <1.70)
Sodium: 141 meq/L (ref 135–145)
Total Protein: 7 g/dL (ref 6.0–8.3)
VLDL: 14 mg/dL (ref 0–40)

## 2008-02-06 ENCOUNTER — Encounter: Payer: Self-pay | Admitting: Infectious Disease

## 2008-02-06 ENCOUNTER — Telehealth: Payer: Self-pay | Admitting: Infectious Disease

## 2008-02-13 ENCOUNTER — Ambulatory Visit: Payer: Self-pay | Admitting: Infectious Disease

## 2008-02-21 ENCOUNTER — Encounter (INDEPENDENT_AMBULATORY_CARE_PROVIDER_SITE_OTHER): Payer: Self-pay | Admitting: *Deleted

## 2008-02-23 ENCOUNTER — Encounter: Payer: Self-pay | Admitting: Infectious Disease

## 2008-05-28 ENCOUNTER — Encounter: Admission: RE | Admit: 2008-05-28 | Discharge: 2008-05-28 | Payer: Self-pay | Admitting: Infectious Disease

## 2008-05-28 ENCOUNTER — Ambulatory Visit: Payer: Self-pay | Admitting: Infectious Disease

## 2008-05-28 LAB — CONVERTED CEMR LAB
ALT: 11 units/L (ref 0–35)
Albumin: 4.4 g/dL (ref 3.5–5.2)
Alkaline Phosphatase: 73 units/L (ref 39–117)
CO2: 22 meq/L (ref 19–32)
Glucose, Bld: 92 mg/dL (ref 70–99)
HIV 1 RNA Quant: 121 copies/mL — ABNORMAL HIGH (ref <50)
Potassium: 4.9 meq/L (ref 3.5–5.3)
Sodium: 140 meq/L (ref 135–145)
Total Protein: 6.9 g/dL (ref 6.0–8.3)

## 2008-06-13 ENCOUNTER — Ambulatory Visit: Payer: Self-pay | Admitting: Infectious Disease

## 2008-06-28 ENCOUNTER — Encounter (INDEPENDENT_AMBULATORY_CARE_PROVIDER_SITE_OTHER): Payer: Self-pay | Admitting: *Deleted

## 2008-07-27 ENCOUNTER — Encounter: Payer: Self-pay | Admitting: Infectious Disease

## 2008-10-19 ENCOUNTER — Ambulatory Visit: Payer: Self-pay | Admitting: Infectious Disease

## 2008-10-19 ENCOUNTER — Encounter: Payer: Self-pay | Admitting: Infectious Diseases

## 2008-10-19 LAB — CONVERTED CEMR LAB
ALT: 16 units/L (ref 0–35)
Albumin: 4.4 g/dL (ref 3.5–5.2)
Alkaline Phosphatase: 76 units/L (ref 39–117)
Basophils Absolute: 0 10*3/uL (ref 0.0–0.1)
Bilirubin Urine: NEGATIVE
CO2: 22 meq/L (ref 19–32)
Eosinophils Absolute: 0.2 10*3/uL (ref 0.0–0.7)
Eosinophils Relative: 3 % (ref 0–5)
GC Probe Amp, Urine: NEGATIVE
HCT: 41 % (ref 36.0–46.0)
Hemoglobin, Urine: NEGATIVE
Ketones, ur: NEGATIVE mg/dL
Lymphocytes Relative: 31 % (ref 12–46)
Lymphs Abs: 1.9 10*3/uL (ref 0.7–4.0)
Neutrophils Relative %: 60 % (ref 43–77)
Nitrite: NEGATIVE
Platelets: 277 10*3/uL (ref 150–400)
Potassium: 4.4 meq/L (ref 3.5–5.3)
RDW: 12 % (ref 11.5–15.5)
Sodium: 142 meq/L (ref 135–145)
Specific Gravity, Urine: 1.028 (ref 1.005–1.03)
Total Bilirubin: 0.4 mg/dL (ref 0.3–1.2)
Total Protein: 6.9 g/dL (ref 6.0–8.3)
Urine Glucose: NEGATIVE mg/dL
WBC: 6.1 10*3/uL (ref 4.0–10.5)
pH: 5.5 (ref 5.0–8.0)

## 2008-11-08 ENCOUNTER — Ambulatory Visit (HOSPITAL_COMMUNITY): Admission: RE | Admit: 2008-11-08 | Discharge: 2008-11-08 | Payer: Self-pay | Admitting: Obstetrics and Gynecology

## 2008-11-08 ENCOUNTER — Ambulatory Visit: Payer: Self-pay | Admitting: Infectious Disease

## 2008-11-08 DIAGNOSIS — N390 Urinary tract infection, site not specified: Secondary | ICD-10-CM | POA: Insufficient documentation

## 2008-11-08 LAB — CONVERTED CEMR LAB
Bilirubin Urine: NEGATIVE
Blood in Urine, dipstick: NEGATIVE
Ketones, urine, test strip: NEGATIVE
Nitrite: NEGATIVE
Protein, U semiquant: NEGATIVE

## 2008-11-09 LAB — CONVERTED CEMR LAB
Bilirubin Urine: NEGATIVE
Specific Gravity, Urine: 1.018 (ref 1.005–1.03)
Urine Glucose: NEGATIVE mg/dL
Urobilinogen, UA: 0.2 (ref 0.0–1.0)
pH: 7.5 (ref 5.0–8.0)

## 2008-11-13 DIAGNOSIS — R82998 Other abnormal findings in urine: Secondary | ICD-10-CM

## 2008-11-19 ENCOUNTER — Encounter: Payer: Self-pay | Admitting: Infectious Disease

## 2009-09-18 ENCOUNTER — Ambulatory Visit: Payer: Self-pay | Admitting: Infectious Disease

## 2009-09-18 LAB — CONVERTED CEMR LAB
ALT: 13 units/L (ref 0–35)
AST: 16 units/L (ref 0–37)
Calcium: 9.2 mg/dL (ref 8.4–10.5)
Chloride: 105 meq/L (ref 96–112)
Creatinine, Ser: 0.75 mg/dL (ref 0.40–1.20)
Eosinophils Absolute: 0.2 10*3/uL (ref 0.0–0.7)
Lymphs Abs: 2.6 10*3/uL (ref 0.7–4.0)
MCV: 97.7 fL (ref >=78.0)
Neutro Abs: 4.3 10*3/uL (ref 1.7–7.7)
Neutrophils Relative %: 56 % (ref 43–77)
Platelets: 277 10*3/uL (ref 150–400)
Total Bilirubin: 0.3 mg/dL (ref 0.3–1.2)
WBC: 7.7 10*3/uL (ref 4.0–10.5)

## 2009-10-03 ENCOUNTER — Encounter: Payer: Self-pay | Admitting: Infectious Disease

## 2009-10-29 ENCOUNTER — Ambulatory Visit: Payer: Self-pay | Admitting: Infectious Disease

## 2009-10-29 ENCOUNTER — Emergency Department (HOSPITAL_COMMUNITY): Admission: EM | Admit: 2009-10-29 | Discharge: 2009-10-29 | Payer: Self-pay | Admitting: Emergency Medicine

## 2009-10-29 ENCOUNTER — Telehealth (INDEPENDENT_AMBULATORY_CARE_PROVIDER_SITE_OTHER): Payer: Self-pay | Admitting: *Deleted

## 2009-10-29 DIAGNOSIS — L272 Dermatitis due to ingested food: Secondary | ICD-10-CM | POA: Insufficient documentation

## 2009-10-29 DIAGNOSIS — R071 Chest pain on breathing: Secondary | ICD-10-CM | POA: Insufficient documentation

## 2009-10-29 LAB — CONVERTED CEMR LAB
HDL goal, serum: 40 mg/dL
LDL Goal: 160 mg/dL

## 2009-11-24 ENCOUNTER — Ambulatory Visit: Payer: Self-pay | Admitting: Diagnostic Radiology

## 2009-11-24 ENCOUNTER — Emergency Department (HOSPITAL_BASED_OUTPATIENT_CLINIC_OR_DEPARTMENT_OTHER): Admission: EM | Admit: 2009-11-24 | Discharge: 2009-11-24 | Payer: Self-pay | Admitting: Emergency Medicine

## 2010-03-19 ENCOUNTER — Ambulatory Visit (HOSPITAL_COMMUNITY): Admission: RE | Admit: 2010-03-19 | Discharge: 2010-03-19 | Payer: Self-pay | Admitting: Infectious Disease

## 2010-03-20 ENCOUNTER — Ambulatory Visit: Payer: Self-pay | Admitting: Infectious Disease

## 2010-03-20 LAB — CONVERTED CEMR LAB
ALT: 14 units/L (ref 0–35)
AST: 18 units/L (ref 0–37)
BUN: 12 mg/dL (ref 6–23)
Basophils Absolute: 0 10*3/uL (ref 0.0–0.1)
Basophils Relative: 1 % (ref 0–1)
Calcium: 8.6 mg/dL (ref 8.4–10.5)
Chloride: 106 meq/L (ref 96–112)
Creatinine, Ser: 0.75 mg/dL (ref 0.40–1.20)
Eosinophils Absolute: 0.2 10*3/uL (ref 0.0–0.7)
Eosinophils Relative: 2 % (ref 0–5)
HCT: 40.1 % (ref 36.0–46.0)
MCHC: 32.9 g/dL (ref 30.0–36.0)
MCV: 95.7 fL (ref 78.0–100.0)
Neutrophils Relative %: 52 % (ref 43–77)
Platelets: 293 10*3/uL (ref 150–400)
RDW: 12.7 % (ref 11.5–15.5)
Total Bilirubin: 0.3 mg/dL (ref 0.3–1.2)

## 2010-04-07 ENCOUNTER — Ambulatory Visit: Payer: Self-pay | Admitting: Infectious Disease

## 2010-11-23 ENCOUNTER — Encounter: Payer: Self-pay | Admitting: Infectious Diseases

## 2010-11-30 LAB — CONVERTED CEMR LAB: Pap Smear: NORMAL

## 2010-12-04 NOTE — Miscellaneous (Signed)
Summary: lab orders   

## 2010-12-04 NOTE — Miscellaneous (Signed)
Summary: PAP NL, Hyst 2005 due tu SIL  Clinical Lists Changes  Observations: Added new observation of PAP SMEAR: Normal - HYST 2005, SIL  (02/21/2008 15:02) Added new observation of LAST PAP DAT: 02/21/2008 (02/21/2008 15:02)
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
## 2010-12-24 ENCOUNTER — Telehealth: Payer: Self-pay | Admitting: Infectious Disease

## 2010-12-30 NOTE — Progress Notes (Addendum)
Summary: pts flu shot status--has moved to [place] or [place]?  Phone Note Outgoing Call   Call placed by: Acey Lav MD,  December 24, 2010 3:24 PM Details for Reason: Flu vaccine and labs Summary of Call: I am trying to contact pt to see if she had flu shot in [place]. She has not been seen since June 2011 I can find an H1N1 from 2009. Do you have any other numbers for her? Initial call taken by: Acey Lav MD,  December 24, 2010 3:28 PM  Follow-up for Phone Call        Patient has moved to [facility] and is trying to establish an ID doctor there. I dont have any other phone numbers for her. Follow-up by: Starleen Arms CMA,  December 25, 2010 10:26 AM       Appended Document: Orders Update    Clinical Lists Changes  Observations: Added new observation of FLUVAXNOT: Refused (01/13/2011 9:54)

## 2011-02-02 LAB — URINALYSIS, ROUTINE W REFLEX MICROSCOPIC
Glucose, UA: NEGATIVE mg/dL
Leukocytes, UA: NEGATIVE
Specific Gravity, Urine: 1.037 — ABNORMAL HIGH (ref 1.005–1.030)
pH: 6.5 (ref 5.0–8.0)

## 2011-02-02 LAB — DIFFERENTIAL
Basophils Absolute: 0 10*3/uL (ref 0.0–0.1)
Eosinophils Absolute: 0.1 10*3/uL (ref 0.0–0.7)
Lymphs Abs: 2.8 10*3/uL (ref 0.7–4.0)
Neutrophils Relative %: 55 % (ref 43–77)

## 2011-02-02 LAB — BASIC METABOLIC PANEL
BUN: 10 mg/dL (ref 6–23)
Creatinine, Ser: 0.74 mg/dL (ref 0.4–1.2)
GFR calc non Af Amer: >60 mL/min (ref >60)
Glucose, Bld: 98 mg/dL (ref 70–99)

## 2011-02-02 LAB — POCT CARDIAC MARKERS: Troponin i, poc: <0.05 ng/mL (ref 0.00–0.09)

## 2011-02-02 LAB — URINE MICROSCOPIC-ADD ON

## 2011-02-02 LAB — CBC
MCV: 98.5 fL (ref 78.0–100.0)
Platelets: 251 10*3/uL (ref 150–400)
RDW: 12 % (ref 11.5–15.5)
WBC: 7.6 10*3/uL (ref 4.0–10.5)

## 2011-02-02 LAB — D-DIMER, QUANTITATIVE: D-Dimer, Quant: <0.22 ug/mL-FEU (ref 0.00–0.48)

## 2011-03-17 NOTE — Op Note (Signed)
Sue Mendez, Sue Mendez                ACCOUNT NO.:  [id number]   MEDICAL RECORD NO.:  [id number]          PATIENT TYPE:  OIB   LOCATION:  3172                         FACILITY:  [facility]   PHYSICIAN:  Stefani Dama, M.D.  DATE OF BIRTH:  05/30/1966   DATE OF PROCEDURE:  08/30/2007  DATE OF DISCHARGE:                               OPERATIVE REPORT   PREOPERATIVE DIAGNOSIS:  Herniated nucleus pulposus, L4-L5, right, with  right lumbar radiculopathy.   POSTOPERATIVE DIAGNOSIS:  Herniated nucleus pulposus, L4-L5, right, with  right lumbar radiculopathy.   PROCEDURE:  METRx diskectomy, L4-L5, right, with operating microscope,  microdissection technique.   SURGEON:  Stefani Dama, M.D.   FIRST ASSISTANT:  None.   ANESTHESIA:  General endotracheal.   INDICATIONS:  Sue Mendez is a 45 year old right-handed individual who  has had significant back and right lower extremity pain.  She had  evidence of a herniated nucleus pulposus behind the body of L5 at the L4-  L5 disk space.  This caused some irritability and compression of the L5  nerve root.  The patient had failed efforts at conservative management  for a prolonged period of time and she was advised regarding surgical  decompression.   PROCEDURE:  The patient was brought to the operating room supine on the  stretcher.  After smooth induction of general endotracheal anesthesia  she was turned prone, and the back was prepped with alcohol and  DuraPrep, and draped in a sterile fashion.  Fluoroscopic guidance was  used to localize the L4-L5 level on the right side.  Then the skin above  this area was infiltrated with 10 mL of 1% lidocaine with epinephrine  1:100,000 mixed 50/50 with 0.5% Marcaine.  A small vertical incision was  created in the lumbar spine.  This was carried down to the lumbodorsal  fascia.  The lumbodorsal fascia was then perforated with a K-wire at the  level of the L4 laminar arch.  Then a series of dilators was  passed over  the K-wire.  Using a wanding technique, dissection was created over the  L4-L5 interspace.  Ultimately a 6 cm deep, 18 mm wide endoscopic cannula  was affixed to the operating table with a clamp.  The soft tissues in  the depths of this area were then cleansed and removed with a monopolar  cautery and a 3 mm Kerrison punch and a pituitary rongeur.  Once the  interlaminar space was exposed, the inferior margin of the lamina of L4  was removed using a high-speed drill and a 3 mm dissecting tool.  Dissection was carried cephalad and laterally up the medial wall of the  facet.  The yellow ligament was thickened and redundant and was  initially taken up with 2 mm and then a 3 mm Kerrison punch.  This  allowed visualization of the common dural tube.  All this work,  including the dissection, was done using a microdissection technique  with the use the operating microscope through the 6 cm deep cannula.  The procedure was then continued by dissecting the dura and mobilizing  it medially.  Epidural veins in this area were cauterized and divided by  using microdissection technique and just inferior to the disk space a  significant bump was noted, and this proved to be a rather scarred down  herniation of the disk.  This was incised and then a series of curettes  were required to remove the fragment of disk from this area.  The area  of the disk space itself was explored and it was noted to be fairly  sealed over with no open rent into the disk space.  The path of the L5  nerve root was then explored and this was found to be free and clear  after removal of the disk.  Hemostasis from the bony edge and the soft  tissues was obtained with bipolar cautery used very judiciously and  carefully into the lateral recess.  In the end the L5 nerve root could  be sounded and was found to be free and clear of any significant mass  effect from the disk herniation behind the body of L5.  Final  localizing  radiographs identified that we were indeed in the right area, with the  appropriate pathology being found and decompressed.  The procedure was  completed.  The wound was copiously irrigated with antibiotic irrigating  solution.  Forty mg of Depo-Medrol along with 0.5 mL of fentanyl was  left in the epidural space.  The cannula was then removed and the  subcutaneous tissue was closed with 3-0 Vicryl along with subcuticular  tissue being closed with 3-0 Vicryl also.  Ten mL of 0.5% Marcaine was  left in the paraspinous musculature.  The patient tolerated the  procedure well and was returned to the recovery room in stable  condition.  Blood loss was estimated at less than 50 mL.      Stefani Dama, M.D.  Electronically Signed     HJE/MEDQ  D:  08/30/2007  T:  08/30/2007  Job:  [id number]

## 2011-03-20 NOTE — H&P (Signed)
NAME:  Sue Mendez, PECKENPAUGH                ACCOUNT NO.:  [id number]   MEDICAL RECORD NO.:  [id number]          PATIENT TYPE:  AMB   LOCATION:  SDC                           FACILITY:  [facility]   PHYSICIAN:  Hal Morales, M.D.DATE OF BIRTH:  December 20, 1965   DATE OF ADMISSION:  DATE OF DISCHARGE:                                HISTORY & PHYSICAL   HISTORY OF PRESENT ILLNESS:  [name] is a 45 year old married white  female, para 2-0-1-2, who is status post hysterectomy, who presents for  operative laparoscopy with oophorectomy because of pelvic pain and vaginal  bleeding.  Since January of 2007, patient reports monthly and sometimes  twice monthly vaginal spotting which lasts approximately two days  accompanied by severe cramping.  Patient describes this cramping as severe,  rating it as an 8/10 on a 10 point pain scale and only relieved with  narcotic medications.  This discomfort will increase with walking, standing  and getting out of bed but will occur intermittently throughout the two to  three-day period.  She denies any urinary tract symptoms, vaginitis  symptoms, fever, nausea, vomiting, diarrhea, or dyspareunia.  A pelvic  ultrasound in April of 2007, showed a septated right ovarian cyst measuring  2.5 x 2.4 x 1.6 cm.  Patient was, at that time, placed on Provera 30 mg  daily for 20 days to suppress her ovarian function.  She had a follow-up  ultrasound in May of 2007, which showed that the cyst had resolved, however,  shortly therefore, she resumed spotting with severe cramping on two separate  occasions within 15 days of each other.  Patient states that this discomfort  and vaginal spotting are quite disruptive and therefore she wishes to  proceed with operative laparoscopy to rule out endometriosis and to further  evaluate her symptoms.   CURRENT MEDICATIONS:  1. Truvada one tablet daily.  2. Sustiva one tablet daily.  3. Multivitamins one tablet daily.  4. Calcium 1000 mg  daily.  5. Vitamin C 1000 mg daily.   ALLERGIES:  NO KNOWN DRUG ALLERGIES.   OBSTETRICAL HISTORY:  1. Gravida 2-0-1-2.  2. Patient had a cesarean section in 1987 and 1989.   GYNECOLOGICAL HISTORY:  1. Menarche at 45 years old.  2. Patient has had a hysterectomy.  3. 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  4. She has a history of HPV, HSV-2 and HIV.  5. Patient's last Pap smear was January 2007 and was within normal limits.   PAST MEDICAL HISTORY:  1. Anemia.  2. Pneumonia.  3. Zoster.  4. T12 vertebral fracture.  5. Chronic sinusitis.  6. Irritable bowel syndrome.  7. Asthmatic bronchitis.  8. Positive HIV   PAST SURGICAL HISTORY:  1. Hysterectomy in 2005.  2. Repair of right clavicle separation in 2006.  She denies any problems with anesthesia.   FAMILY HISTORY:  Breast cancer and skin cancer.   SOCIAL HISTORY:  Patient is married and she works for [employer].   HABITS:  She does not use tobacco, occasionally will consume alcohol.  REVIEW OF SYSTEMS:  Positive for glasses, seasonal allergies and except as  mentioned in history of present illness, patient's review of systems is  negative.   PHYSICAL EXAMINATION:  VITAL SIGNS:  Blood pressure 100/80, weight 175,  height 5 feet 3-1/2 inches tall.  NECK:  Supple without masses.  There is no thyromegaly or cervical  adenopathy.  LUNGS:  Clear.  No wheezing, rhonchi or rales.  BACK:  No CVA tenderness.  CARDIOVASCULAR:  Regular rate and rhythm.  There is no murmur.  ABDOMEN:  Tender in the right lower quadrant without guarding.  There is no  rebound, masses or organomegaly.  EXTREMITIES:  Without clubbing, cyanosis, or edema.  PELVIC:  EGBUS is within normal limits.  Vagina is rugous.  Cervix and  uterus are surgically absent.  Adnexa with right-sided tenderness but no  palpable masses.  RECTOVAGINAL:  Patient has, at the 3 o'clock position of her anus, an  inflamed herpetic  lesion.  Otherwise rectovaginal exam is without masses.   IMPRESSION:  1. Pelvic pain.  2. Vaginal bleeding.  3. Status post hysterectomy.  4. Herpes simplex virus #2 flare.   DISPOSITION:  A discussion was held with the patient regarding the  indications for her procedure along with its risks which include, but are  not limited to, reaction to anesthesia, damage to adjacent organs,  infection, excessive bleeding and the possibility that her surgery may  require an abdominal incision.  Patient was given Famvir 500 mg  to take twice daily for five days for her herpes flare.  She was also given  a copy of the ACOG brochure entitled laparoscopy.  Patient has consented to  proceed with operative laparoscopy at [facility] [place] along  with the possibility of oophorectomy on May 18, 2006, at 10 o'clock a.m.      Sue Mendez.      Hal Morales, M.D.  Electronically Signed    EJP/MEDQ  D:  05/12/2006  T:  05/12/2006  Job:  [id number]

## 2011-03-20 NOTE — Op Note (Signed)
NAME:  Sue Mendez, Sue Mendez                          ACCOUNT NO.:  [id number]   MEDICAL RECORD NO.:  [id number]                   PATIENT TYPE:  OBV   LOCATION:  9310                                 [facility]ILITY:  [facility]   PHYSICIAN:  Hal Morales, M.D.             DATE OF BIRTH:  [birth year]   DATE OF PROCEDURE:  03/04/2004  DATE OF DISCHARGE:                                 OPERATIVE REPORT   PREOPERATIVE DIAGNOSES:  1. Menorrhagia.  2. Dysmenorrhea.  3. Pelvic pain.  4. Cervical intraepithelial neoplasia.   POSTOPERATIVE DIAGNOSES:  1. Menorrhagia.  2. Dysmenorrhea.  3. Pelvic pain.  4. Cervical intraepithelial neoplasia.  5. Right corpus luteum cyst.   OPERATION:  Laparoscopically assisted vaginal hysterectomy.   SURGEON:  Hal Morales, M.D.   FIRST ASSISTANTS:  1. [name] Lowell Guitar, P.A.C.  2. Osborn Coho, M.D.   ANESTHESIA:  General orotracheal.   ESTIMATED BLOOD LOSS:  300 mL.   COMPLICATIONS:  Rupture of right corpus luteum cyst requiring suture.   FINDINGS:  The uterus was normal size.  There were adhesions anteriorly near  the anterior peritoneum, probably secondary to previous cesarean section.  The left ovary was within normal limits.  Both tubes were status post  interruption for tubal sterilization.  The left round ligament was adherent  to the left pelvic sidewall.  The right round ligament was not easily  visualized.  The right ovary contained a 4 cm corpus luteum cyst.  There  were no stigmata of endometriosis.   DESCRIPTION OF PROCEDURE:  The patient was taken to the operating room after  appropriate identification and placed on the operating table.  After the  attainment of adequate general anesthesia, she was placed in the modified  lithotomy position.  The abdomen, perineum, and vagina were all prepped with  multiple layers of Betadine.  Foley catheter was inserted into the bladder  and connected to straight drainage.  The Hulka tenaculum was  placed on the  cervix.  The abdomen and perineum were draped as a sterile field.  Subumbilical and suprapubic injections of 0.25% Marcaine for a total of 10  mL were undertaken.  A subumbilical incision was made and Veress cannula  placed through that incision into the peritoneal cavity.  A pneumoperitoneum  was created with 3 liters of CO2.  the Veress cannula was removed and the  laparoscopic trocar placed through the subumbilical incision into the  peritoneal cavity.  A laparoscope was placed through the trocar sleeve.  Suprapubic incisions were made to the right and left of midline and  laparoscopic probe trocar was placed through those incisions into the  peritoneal cavity  under direct visualization.  The above noted findings  were made and documented.  The left round ligament was then cauterized and  cut and the anterior leaf of the broad ligament incised. The bladder flap  was developed by taking down the  aforementioned anterior cul-de-sac  adhesions to allow the bladder to be dissected off the anterior cervix.  As  the round ligament on the right side could not be visualized, no further  operating via the laparoscope was undertaken and the patient was prepared  for vaginal hysterectomy.  A weighted speculum was placed in the posterior  vagina and a Lahey tenaculum placed on the cervix.  The cervicovaginal  mucosa was injected with a dilute solution of Pitressin and circumscribed.  The anterior vaginal mucosa was bluntly dissected off the anterior cervix.  The posterior peritoneum was entered sharply and tagged.  The uterosacral  ligaments on the right and left side were clamped, cut, and suture ligated  and those sutures held.  The paracervical tissues, parametrial tissues were  successively clamped, cut, and suture ligated on the right and left sides.  The anterior peritoneum was then entered and the uterus inverted and the  upper pedicles clamped, cut and tied with free ties and  suture ligated.  Uterus was removed from the operative field.  Hemostasis was achieved in the  posterior vaginal cuff with figure-of-eight sutures.  It was noted at that  time that the right corpus luteum cyst was bleeding and it was oversewn with  sutures of 2-0 Vicryl in a mattress fashion to achieve adequate hemostasis.  It was then wrapped in a piece of Surgicel with adequate hemostasis.  The  vaginal angles were then created with the sutures that had been held from  the uterosacral ligament anterior and posterior in the vaginal cuff and tied  down.  Prior to doing this, McCall culdoplasty suture was placed in the  posterior cul-de-sac incorporating the uterosacral ligaments on the right  and left side and the intervening posterior peritoneum.  The remainder of  the vaginal cuff was closed in a single layer incorporating the anterior  vaginal mucosa, anterior peritoneum, posterior peritoneum and posterior  vaginal mucosa in figure-of-eight sutures.  Once this had been completed,  the [facility] culdoplasty suture was tied down.  The vagina was then packed  with two inch packing which had been moistened with Estrace cream.  The  surgeon changed gloves and returned to the laparoscope where a  pneumoperitoneum was recreated.  Copious irrigation was carried out and  hemostasis noted to be adequate.  All instruments were removed from the  peritoneal cavity under direct visualization as the CO2 was allowed to  escape.  The subumbilical incision was closed with a deep fascial suture of  0 Vicryl and then subcuticular sutures of 3-0 Vicryl.  The suprapubic  incisions were closed with subcuticular sutures of 3-0 Vicryl and Steri-  Strips.  A sterile dressing was applied to the subumbilical incision.  The  patient was then awakened from general anesthesia and taken to the recovery  room in satisfactory condition having tolerated the procedure well with sponge and instrument counts correct.    SPECIMENS:  Uterus and cervix.  The cervix will be treated as a cone.                                               Hal Morales, M.D.    VPH/MEDQ  D:  03/04/2004  T:  03/05/2004  Job:  [id number]   cc:   Rockey Situ. Flavia Shipper., M.D.  [address]  [place]  [place] [address]  Fax: [phone]

## 2011-03-20 NOTE — H&P (Signed)
NAME:  Sue Mendez, Sue Mendez                          ACCOUNT NO.:  [id number]   MEDICAL RECORD NO.:  [id number]                   PATIENT TYPE:  OBV   LOCATION:  9306                                 [facility]ILITY:  [facility]   PHYSICIAN:  Naima A. Dillard, M.D.              DATE OF BIRTH:  04/02/1966   DATE OF ADMISSION:  03/05/2004  DATE OF DISCHARGE:                                HISTORY & PHYSICAL   CHIEF COMPLAINT:  Postoperative day #1, status post laparoscopically  assisted vaginal hysterectomy with abdominal pain and febrile morbidity.   HISTORY OF PRESENT ILLNESS:  Sue Mendez is a 45 year old, gravida 3, para 2-  0-1-2, who is status post laparoscopically assisted vaginal hysterectomy on  Mar 04, 2004, for menorrhagia, pelvic pain, and dysmenorrhea.  The patient  called and stated that she had a temperature of 100.2 degrees with severe  abdominal cramping unrelieved by her pain medication.  The patient presented  to maternity admissions with a temperature of 100.2 degrees.  She denies  having any urinary tract infection symptoms.  No nausea or vomiting.  She  has had flatus, but no BM.  She does have a history of constipation.  She  denies any flu-like symptoms, chest pain, or shortness of breath.   PAST SURGICAL HISTORY:  The patient had cesarean sections in 1987 and 1989.   PAST GYNECOLOGICAL HISTORY:  Significant for menarche at age 1.  Her  menstrual flow is regular, however, she suffers severe dysmenorrhea.  She  had a tubal ligation.  She also uses condoms.  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   PAST SURGICAL HISTORY:  Significant for in 1987, she had a cesarean section  and in 1989 she had a cesarean section and bilateral tubal ligation.  The  patient denies any problems with anesthesia or history of blood  transfusions.   FAMILY HISTORY:  Positive for breast cancer in a  maternal grandmother, who  was diagnosed postmenopausally.   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  She does not use tobacco.  She drinks alcohol socially.  She denies any illicit drug use.   CURRENT MEDICATIONS:  1. Sustiva 600 mg one tablet at bedtime.  2. Truvada one tablet at bedtime.  3. Tylox one to two tablets q.4-6h. p.r.n.  4. Ibuprofen 600 mg q.6h. p.r.n.  5. Colace.   ALLERGIES:  The patient has no known drug allergies.   REVIEW OF SYSTEMS:  The patient does wear corrective lenses.  She  occasionally has constipation.  She is status post TVH.  She also has HIV.  She reports that her viral load was 124.   PHYSICAL EXAMINATION:  VITAL SIGNS:  The patient's temperature is 102  degrees, pulse 87, respiratory  rate 20, and blood pressure 115/74.  GENERAL APPEARANCE:  She is in moderate distress secondary to abdominal  pain.  HEART:  Regular rate and rhythm.  LUNGS:  Clear to auscultation bilaterally.  ABDOMEN:  Nondistended with good bowel sounds and soft.  GENITOURINARY:  I just did a pelvic exam.  Her cuff was intact.  There was  some moderate tenderness.  I was unable to palpate a collection.  EXTREMITIES:  No cyanosis, clubbing, or edema.   PERTINENT LABORATORIES:  Her UA is significant for a large hemoglobin and  some yeast.  Her BUN and creatinine are normal at 6 and 0.8.  Sodium is  normal at 136.  The potassium is 3.7.  Glucose is 108.  All laboratories are  relatively normal.  Her white count is 9.9 and her hemoglobin is 10.7.  Before she left today, it was 10.6.  Platelets are 254 with 55 segs.   ASSESSMENT:  Status post total vaginal hysterectomy, postoperative day #1,  with febrile morbidity.   PLAN:  Admit to the GYN service.  I will alert Dr. Pennie Rushing in the morning.  I will obtain an abdominal flat plate to rule out any bowel injury.  Plan to  do an ultrasound in the morning to rule out any pelvic  collection.  I will  start on Unasyn antibiotics even though the patient has not mounted a severe  white count.  Because of her temperature and history of HIV, I think it is  important to start antibiotics.  We will obtain blood and urine cultures.  Will give her pain medications with bowel stimulation.  Just something to  consider, if we are unable to find a source of fever, may consider an  abdominopelvic CT with contrast and consult infectious disease.  I will also  continue the patient on her current medications.                                               Naima A. Normand Sloop, M.D.    NAD/MEDQ  D:  03/05/2004  T:  03/05/2004  Job:  [id number]

## 2011-03-20 NOTE — Op Note (Signed)
NAME:  Sue Mendez, Sue Mendez                ACCOUNT NO.:  [id number]   MEDICAL RECORD NO.:  [id number]          PATIENT TYPE:  AMB   LOCATION:  SDC                           [facility]   PHYSICIAN:  Hal Morales, M.D.DATE OF BIRTH:  18-Oct-1966   DATE OF PROCEDURE:  05/18/2006  DATE OF DISCHARGE:                                 OPERATIVE REPORT   PREOPERATIVE DIAGNOSES:  Right lower quadrant pain, persistent right ovarian  cyst, vaginal bleeding, status post vaginal hysterectomy.   POSTOPERATIVE DIAGNOSES:  Right lower quadrant pain, persistent right  ovarian cyst, vaginal bleeding, status post vaginal hysterectomy, pelvic  adhesions   PROCEDURE:  Operative laparoscopy, lysis of adhesions, right salpingo-  oophorectomy.   SURGEON:  Hal Morales, M.D.   ASSISTANT:  Janine Limbo, M.D.   ANESTHESIA:  General orotracheal.   COMPLICATIONS:  None.   FINDINGS:  The patient was status post vaginal hysterectomy.  The cul-de-sac  and area that would be adjacent to the vaginal cuff were smooth without  lesions.  The right ovary was plastered to the right pelvic sidewall and was  normal sized.  It did contain a 3 cm apparent corpus luteum cyst.  The tube  appeared within normal limits, as well, except for extensive adhesions to  the right pelvic sidewall.  The left ovary appeared within normal limits.   DESCRIPTION OF PROCEDURE:  The patient was taken to the operating room after  appropriate identification and placed on the operating table.  After the  attainment of adequate general anesthesia, she was placed in the modified  lithotomy position.  The abdomen, perineum and vagina were prepped with  multiple layers of Betadine.  A Foley catheter was inserted into the bladder  and connected to straight drainage.  A sponge stick containing three Ray-Tec  sponges were placed in the vagina.  The abdomen was draped in a sterile  field.  Subumbilical and suprapubic injections of  0.25% Marcaine for total  of 10 mL was undertaken.  A subumbilical incision was made and using S  retractors and blunt dissection, the fascia was identified, grasped with  Kocher clamps, and incised.  The fascia was then sutured with sutures of 0  Vicryl as holding sutures.  The peritoneum was grasped and elevated and  entered sharply.  The Hassan cannula was placed into the peritoneal cavity  and anchoring fascial sutures wrapped around [place] prongs.  The  pneumoperitoneum was then created and the laparoscope placed through the  trocar sleeve.  Suprapubic incisions to the right and left of midline were  made.  Laparoscopic probe trocars were placed through those incisions into  the peritoneal cavity under direct visualization.  The above noted findings  were made and documented.  The right ovary was then elevated and after  identification of the right ureter, lysis of adhesions allowing further  elevation of the right ovary was undertaken.  This was done with a  combination of sharp dissection and hydrodissection.  Once adequate lysis of  adhesions and dissection had occurred to identify and skeletonize the  infundibulopelvic ligament,  two Endoloops were tied around the  infundibulopelvic ligament and cinched down.  The remaining tube and ovary  were then excised with the Gyrus apparatus and placed into the endobag with  the aid of a 5 mm laparoscope through the left suprapubic port.  The endobag  was then brought through the abdominal incision and removed from the  operative field.  The Hassan cannula was replaced through the subumbilical  incision and inspection of the area of dissection and excision of the ovary  undertaken with hemostasis noted to be adequate.  Copious irrigation was  carried out and a small amount of lactated Ringer's left in the peritoneal  cavity.  All instruments were then removed from the peritoneal cavity under  direct visualization as CO2 was allowed to escape.   The subumbilical  incision was closed with the fascial holding sutures creating two figure-of-  eight sutures in the fascia.  A subcutaneous suture of 3-0 Vicryl was then  placed and the skin incision closed with a subcuticular suture of 3-0  Vicryl.  The suprapubic incisions were closed with subcuticular sutures of 3-  0 Vicryl.  All incisions were covered with sterile bandages and the Foley  catheter and  sponge stick removed.  The patient was awakened from general  anesthesia and taken to the recovery room in satisfactory condition having  tolerated the procedure well with sponge and instrument counts correct.   SPECIMENS TO PATHOLOGY:  Right tube and ovary.      Hal Morales, M.D.  Electronically Signed     VPH/MEDQ  D:  05/18/2006  T:  05/18/2006  Job:  [id number]
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
## 2011-03-20 NOTE — Op Note (Signed)
[facility]  Patient:    Sue Mendez, Sue Mendez                       MRN: [id number] Proc. Date: 10/15/00 Adm. Date:  [id number] Attending:  Katha Cabal CC:         Burnice Logan, M.D.   Operative Report  CCS#: [id number]  PREOPERATIVE DIAGNOSES:  Multiple condylomata of the mons pubis.  POSTOPERATIVE DIAGNOSES:  Multiple condylomata of the mons pubis.  PROCEDURE:  Excision of multiple condylomata.  SURGEON:  Dr. Daphine Deutscher.  ANESTHESIA:  General.  DESCRIPTION OF PROCEDURE:  Sue Mendez is a 45 year old lady HIV positive who had recurrent condylomata of the mons pubis and was seen in the office. We discussed excision and primary closure rather than just laser oblation. On the morning of her presentation, she had 2 condylomata about 1 cm in diameter and there was 1 much smaller 1 that was about 3 mm in diameter. After prepping the mons pubis with Betadine and draping sterilely, I used elliptical incisions to excise the 2 condylomata and then close this with multiple layers using 4-0 Vicryl and with Prolene in the skin. The second smaller one was elevated and excised through a little ellipse in the base that was cauterized with the electrocautery. Sterile dressings with Neosporin were applied. The patient tolerated the procedure well. She will be discharged to take Vicodin as needed for pain and will be followed in the office in 1 week for suture removal. DD:  10/15/00 TD:  10/15/00 Job: [id number]

## 2011-03-20 NOTE — H&P (Signed)
NAME:  CHAU, SAVELL                          ACCOUNT NO.:  [id number]   MEDICAL RECORD NO.:  [id number]                   PATIENT TYPE:  OBV   LOCATION:  NA                                   [facility]   PHYSICIAN:  [name] Lowell Guitar, P.A.              DATE OF BIRTH:  23-Sep-1966   DATE OF ADMISSION:  DATE OF DISCHARGE:                                HISTORY & PHYSICAL   HISTORY OF PRESENT ILLNESS:  Sue Mendez is a 45 year old married white  female, para 2, 0, 1, 2, who presents for laparoscopically assisted vaginal  hysterectomy because of menorrhagia, pelvic pain, dysmenorrhea, dyspareunia  and CIN-I.  For the past two years the patient has experienced random crampy  pelvic pain, which she rates as a 7/10 on a ten-point scale, which will last  for days at a time and accompanied by deep dyspareunia, which is not  relieved with position change.  When the patient's seven-day menstrual flow  begins she is often confined to her bed while she experiences a 10/10 on a  ten-point scale crampy pain that is not relieved by over-the-counter  analgesia nor any nonmedicinal comfort measures she has tried.  Her flow is  such that she changes a super plus tampon every one to two hours and has, on  occasion, caused her to soil her clothes.  She denies any urinary tract  symptoms, vaginitis symptoms, fever, nausea, vomiting, or diarrhea.   The patient has a history of CIN-I, which in the past was treated with  cryotherapy.  Her last Pap smear in February 2005 showed low-grade squamous  intraepithelial lesions; however, the biopsy from her colposcopic evaluation  showed benign findings.  Gonorrhea and Chlamydia cultures done at the time  of her Pap smear were both negative.   After consideration of her medical and surgical options for managing her  symptoms (including observation) the patient wishes to proceed with  definitive therapy for her complaints in the form of a hysterectomy.   PAST  OBSTETRICAL HISTORY:  Gravida 3, para 2, 0, 1, 2.  The patient had a  cesarean section in 1987 and 1989.  She experienced anemia during her  pregnancies.   PAST GYNECOLOGICAL HISTORY:  Menarche at 45 years old.  The patient's  menstrual flow is regular; see history of present illness.  She had a  bilateral tubal ligation and condoms as methods of contraception.  The  patient does have history of the human papilloma virus human  immunodeficiency virus.  She has a history of abnormal Pap smears as  mentioned in her history of present illness.   PAST MEDICAL HISTORY:  Medical history is positive for the human  immunodeficiency virus, pneumonia in 1999 and anemia.   PAST SURGICAL HISTORY:  1. In 1987 cesarean section.  2. In 1989 cesarean section and bilateral tubal ligation.   The patient denies any problems with anesthesia  or history of blood  transfusions.   FAMILY HISTORY:  Family history is positive for cancer of the breast  (maternal grandmother, postmenopausal) and skin cancer.   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   HABITS:  The patient drinks alcohol socially.  She does not use tobacco.   CURRENT MEDICATIONS:  1. Provera 10 mg 4 tablets daily.  2. Sustiva 600 mg 1 tablet at bedtime.  3. Truvada (2 mg emtricitabine/300 mg tenofovir) 1 tablet at bedtime.   ALLERGIES:  The patient has no known drug allergies.   REVIEW OF SYSTEMS:  The patient does have a full upper dental plate.  She  does wear glasses.  She occasionally has problems with constipation;  otherwise, the review of systems is negative, except as mentioned in the  history of present illness.   PHYSICAL EXAMINATION:  VITAL SIGNS:  Blood pressure 100/70.  Weight is 179  pounds.  Height is 5 feet 3.5 inches tall.  NECK:  Neck is supple without thyromegaly or adenopathy.  HEART:  Heart has regular rate and rhythm.  There is no murmur.  LUNGS:   Lungs are clear to auscultation.  There are wheezes, rales or  rhonchi.  BACK:  No CVA tenderness.  ABDOMEN:  Bowel sounds are present.  It is soft without tenderness,  guarding, rebound or organomegaly.  EXTREMITIES:  Without clubbing, cyanosis or edema.  VAGINAL EXAMINATION:  Vagina; EG BUS within normal limits.  Vagina is rugous  and tender.  Cervix is nontender without lesions.  The uterus appears normal  size, shape and consistency, and it is tender.  Adnexa without any palpable  masses; however, there is tenderness bilaterally.  Rectovaginal  exam;  without any masses; however, there is tenderness.   LABORATORY DATA:  Pelvic ultrasound (January 23, 2004) revealed a normal size  uterus measuring 10.7 cm x 4.4 cm x 4.8 cm with normal appearing right and  left ovaries.  Endometrial thickness was 0.55 cm on day 10 of the patient's  cycle.  There was no free fluid observed.   IMPRESSION:  1. Pelvic pain.  2. Menorrhagia.  3. Dysmenorrhea.  4. History of carcinoma in situ-I.   DISPOSITION:  A discussion was held with the patient regarding the medical  and surgical options for management of her presenting symptoms and history;  and, the patient has chosen to proceed with hysterectomy.  The patient  further understands and agrees to access the risks associated with her  procedure, which include, but are not limited to reaction to anesthesia,  damage to adjacent organs, infection, excessive bleeding, the possibility of  an abdominal hysterectomy as well as possible bilateral oophorectomy.  The  patient is scheduled to undergo laparoscopically assisted vaginal  hysterectomy at [facility] on Mar 04, 2004 at 11:00 A.M.                                               [name] [name].    EJP/MEDQ  D:  02/29/2004  T:  02/29/2004  Job:  [id number]

## 2011-03-20 NOTE — Discharge Summary (Signed)
NAME:  Sue Mendez, Sue Mendez                          ACCOUNT NO.:  [id number]   MEDICAL RECORD NO.:  [id number]                   PATIENT TYPE:  INP   LOCATION:  9306                                 [facility]  [facility]   PHYSICIAN:  Naima A. Dillard, M.D.              DATE OF BIRTH:  1966/09/05   DATE OF ADMISSION:  03/05/2004  DATE OF DISCHARGE:  03/10/2004                                 DISCHARGE SUMMARY   DISCHARGE DIAGNOSES:  1. Pelvic hematoma.  2. Status post laparoscopically assisted vaginal hysterectomy (Mar 04, 2004).  3. Right corpus luteum cyst.   PROCEDURE:  1. On Mar 05, 2004, the patient had an acute abdominal series which showed     acute process.  2. On Mar 06, 2004, pelvic ultrasound showed findings confirmatory of a     postoperative hematoma directly superior to the vaginal cuff (the     irregular fluid collection measured 2.8 x 2.0 x 2.6 cm and contained     diffuse low level echoes). The patient had a normal-appearing right ovary     and the left ovary was not able to be visualized during this study.  3. On Mar 07, 2004, the patient had a CT scan of the abdomen and pelvis. CT     of the abdomen was negative with bibasilar atelectasis. CT of the pelvis     revealed postoperative changes in the pelvis with punctate areas of free     intraperitoneal air and subcutaneous air.  There is a probable complex     fluid in the pelvis that represents a postoperative hematoma, but a     phlegmon collection cannot be entirely excluded. There was no air within     the complex fluid to suggest that this was a representation of an     abscess.   HISTORY OF PRESENT ILLNESS:  [name] is a 45 year old married white  female, para 2-0-1-2, who is status post laparoscopically assisted vaginal  hysterectomy on Mar 04, 2004, which was done for a menorrhagia, pelvic pain,  and dysmenorrhea. He presents with complaints of severe abdominal pain and  fever (102 degrees Fahrenheit orally). The  patient is being admitted for  evaluation and pain management. Please see patient's dictated history and  physical examination for details.   PHYSICAL EXAMINATION:  VITAL SIGNS: Admission physical examination revealed  vital signs of 102 degrees Fahrenheit orally, pulse 87, respiratory rate 20,  and blood pressure 115/74.  GENERAL APPEARANCE: The patient was in moderate distress because of  abdominal pain. General exam was within normal limits.  ABDOMEN: Nondistended with good bowel sounds and soft.  GENITOURINARY:  An intact vaginal cuff. However, there was some moderate  tenderness at the cuff, but no discrete collection palpated.   HOSPITAL COURSE:  On the day of admission, patient, who presented with  complaints of severe abdominal pain with fever was placed on IV  antibiotics  (Unasyn).  Over the course of the hospital stay the patient underwent the  previously mentioned studies with findings as listed along with an  infectious disease consult due to her history of being HIV positive. The  infectious disease specialist (Drs. Roxan Hockey and Orvan Falconer) concurred with  patient's therapy and management stating that in spite of her HIV status the  patient was essentially a normal host.  The patient slowly responded to her  antibiotic therapy, analgesia, and supportive measures such that by hospital  day #6 she was afebrile with return of bowel and bladder function and was  able to control her pain with oral medications. Having received the maximum  benefit of her hospital stay at this point the patient was discharged home.  The note that patient's CBCs and comprehensive metabolic panel throughout  her stay were within normal limits (though the patient was mildly anemic  with a hemoglobin of 10.1).  She was also found to have blood cultures which  demonstrated no growth after five days of incubation.   DISCHARGE MEDICATIONS:  Augmentin 875 mg one tablet b.i.d. for seven days.  The patient  was also instructed to continue her preoperative medications as  well as those given for her postoperative period.   FOLLOWUP:  The patient is scheduled for a repeat ultrasound of [facility] on Mar 17, 2004, at 9 a.m. She is to follow up with  Dr. Dierdre Forth immediately following her ultrasound at [facility].   DISCHARGE INSTRUCTIONS:  The patient is advised to call for any temperature  greater than 100.4 degrees Fahrenheit orally, any uncontrolled pain,  questions, or concerns (the patient was referred to her postoperative  instruction sheet give at her previous discharge).   ACTIVITY:  Activity was delineated at patient's postoperative discharge for  her laparoscopically-assisted vaginal hysterectomy.   DIET:  Without restriction.     [name] [name].                    Naima A. Normand Sloop, M.D.    EJP/MEDQ  D:  03/18/2004  T:  03/19/2004  Job:  [id number]

## 2011-03-20 NOTE — Discharge Summary (Signed)
NAME:  Sue Mendez, Sue Mendez                          ACCOUNT NO.:  [id number]   MEDICAL RECORD NO.:  [id number]                   PATIENT TYPE:  OBV   LOCATION:  9310                                 [facility]   PHYSICIAN:  Hal Morales, M.D.             DATE OF BIRTH:  05/04/1966   DATE OF ADMISSION:  03/04/2004  DATE OF DISCHARGE:  03/05/2004                                 DISCHARGE SUMMARY   DISCHARGE DIAGNOSES:  1. Dysmenorrhea.  2. Menorrhagia, CIN-I.  3. Right corpus luteum cyst.   OPERATION:  On the date of admission the patient underwent a  laparoscopically assisted vaginal hysterectomy with repair of a ruptured  corpus luteum cyst, tolerating all procedures well. The patient was found to  have a normal size uterus along with a normal-appearing left ovary,  previously ligated tubes bilaterally, and a right 4-cm corpus luteum cyst.   HISTORY OF PRESENT ILLNESS:  Sue Mendez is a 45 year old married white  female, para 2-0-1-2, who presents for a laparoscopically assisted vaginal  hysterectomy because of menorrhagia, pelvic pain, dysmenorrhea, dyspareunia,  and CIN-I.  Please see patient's dictated history and physical examination  for details.   PHYSICAL EXAMINATION:  Preoperative physical exam revealed vital signs with  blood pressure of 100/70, weight 179 pounds, height 5 feet 3.5 inches tall.  General exam was within normal limits. Pelvic examination revealed EGBUS  within normal limits. Vagina was rugous and tender. Cervix was nontender  without lesions. Uterus appeared normal size, shape, and consistency, but  was tender. Adnexa was without any palpable masses. However, there was  bilateral tenderness. Rectovaginal exam without masses; however, that also  was tender.   HOSPITAL COURSE:  On the day of admission the patient underwent  aforementioned procedure, tolerated them well. Her postoperative course was  unremarkable with the patient resuming bowel and  bladder function by  postoperative day one and therefore deemed ready for discharge home.  Discharge hemoglobin was 10.6 (preoperative hemoglobin 13.2).   DISCHARGE MEDICATIONS:  1. Ferrous sulfate 325 mg one tablet b.i.d. for six weeks.  2. Colace 100 mg b.i.d. until bowel movements are regular.  3. Ibuprofen 600 mg one tablet with food q.6h. for five days, then as needed     for pain.  4. Phenergan 25 mg one tablet q.6h. as needed for nausea.  5. Tylox one or two tablets q.4-6h. p.r.n. pain.   FOLLOWUP:  The patient is scheduled for a follow-up visit with Dr. Pennie Rushing  on April 15, 2004, at 2:45 p.m.   DISCHARGE INSTRUCTIONS:  The patient is given a copy of [facility] postoperative instructive sheet. She is further advised to avoid driving  for two weeks, heavy lifting for four weeks, and intercourse for six weeks.  The patient's diet was without restrictions. Final pathology was not  available at the time of this dictation.     Sue J.  Sue Mendez.                    Hal Morales, M.D.    EJP/MEDQ  D:  03/18/2004  T:  03/19/2004  Job:  [id number]

## 2011-03-20 NOTE — Discharge Summary (Signed)
NAME:  Sue Mendez, Sue Mendez                          ACCOUNT NO.:  [id number]   MEDICAL RECORD NO.:  [id number]                   PATIENT TYPE:  OBV   LOCATION:  9310                                 [facility]   PHYSICIAN:  Hal Morales, M.D.             DATE OF BIRTH:  [birth year]   DATE OF ADMISSION:  03/04/2004  DATE OF DISCHARGE:  03/05/2004                                 DISCHARGE SUMMARY   ADDENDUM:  The patient's final pathology, hysterectomy/uterus: Cervix was small, foci  of residual low-grade intraepithelial lesion (CIN-I) ectocervical margin  negative for dysphagia, benign weakly secretory endometrium, cellular  leiomyoma, and focal serosal fibrous adhesion.     [name] [name].                    Hal Morales, M.D.    EJP/MEDQ  D:  03/18/2004  T:  03/19/2004  Job:  [id number]

## 2011-07-24 LAB — T-HELPER CELL (CD4) - (RCID CLINIC ONLY)
CD4 % Helper T Cell: 31 — ABNORMAL LOW
CD4 T Cell Abs: 840

## 2011-07-28 LAB — DIFFERENTIAL
Lymphocytes Relative: 37
Lymphs Abs: 2.3
Monocytes Relative: 5
Neutro Abs: 3.5
Neutrophils Relative %: 56

## 2011-07-28 LAB — CBC
HCT: 40.3
Hemoglobin: 13.7
RBC: 4.16
RDW: 13.1
WBC: 6.3

## 2011-07-28 LAB — T-HELPER CELL (CD4) - (RCID CLINIC ONLY): CD4 % Helper T Cell: 32 — ABNORMAL LOW

## 2011-07-31 LAB — T-HELPER CELL (CD4) - (RCID CLINIC ONLY)
CD4 % Helper T Cell: 31 — ABNORMAL LOW
CD4 T Cell Abs: 680

## 2011-08-07 LAB — T-HELPER CELL (CD4) - (RCID CLINIC ONLY)
CD4 % Helper T Cell: 35 % (ref 33–55)
CD4 T Cell Abs: 640 uL (ref 400–2700)

## 2011-08-12 LAB — CBC
MCV: 97
Platelets: 270
RBC: 4.01
WBC: 8.1

## 2011-08-14 LAB — T-HELPER CELL (CD4) - (RCID CLINIC ONLY)
CD4 % Helper T Cell: 29 — ABNORMAL LOW
CD4 T Cell Abs: 730

## 2012-01-12 ENCOUNTER — Other Ambulatory Visit: Payer: Self-pay | Admitting: *Deleted

## 2012-01-12 DIAGNOSIS — B20 Human immunodeficiency virus [HIV] disease: Secondary | ICD-10-CM

## 2012-01-12 MED ORDER — EFAVIRENZ-EMTRICITAB-TENOFOVIR 600-200-300 MG PO TABS: 1.0000 | ORAL_TABLET | Freq: Every day | ORAL | Status: DC

## 2012-01-12 NOTE — Telephone Encounter (Signed)
I asked her to make an appt. States she has been in [place] for a year and is going to [place] soon. (company transfers). She saw a doctor there but will not go back to him. I told her I will send in 30 days. She must find a doctor to monitor her & prescribe refills. She has had difficulty paying for her meds. They cost $1700 per month. I asked her to see a md & see if they have a way to help her with the costs

## 2012-02-10 ENCOUNTER — Other Ambulatory Visit: Payer: Self-pay | Admitting: *Deleted

## 2012-02-10 DIAGNOSIS — B20 Human immunodeficiency virus [HIV] disease: Secondary | ICD-10-CM

## 2012-02-10 MED ORDER — EFAVIRENZ-EMTRICITAB-TENOFOVIR 600-200-300 MG PO TABS: 1.0000 | ORAL_TABLET | Freq: Every day | ORAL | Status: DC

## 2012-05-12 ENCOUNTER — Telehealth: Payer: Self-pay | Admitting: *Deleted

## 2012-05-12 NOTE — Telephone Encounter (Signed)
Patient called she is living in [place] and needs refills on HIV meds, last seen here in 2011.  She was previously living in [place]. She can not get into an ID clinic until August. Told her I will send note to Dr. Daiva Eves and if he Oks refills then I will do.  Her # [phone].

## 2012-05-12 NOTE — Telephone Encounter (Signed)
Patient called requesting refill on HIV meds, has not been seen in over 2 years.  Spoke with [facility] RN, 01/2012 and was given a 30 day supply and told she needed to get established where she is now living. Left her a voice mail that we can not refill her meds without a visit. Wendall Mola CMA

## 2012-05-12 NOTE — Telephone Encounter (Signed)
She can get refills but is she needs to either  A)_establish means of being seen by me in [place]   Or   B) of seeing ID MD in [place]

## 2012-05-13 ENCOUNTER — Other Ambulatory Visit: Payer: Self-pay | Admitting: *Deleted

## 2012-05-13 DIAGNOSIS — B20 Human immunodeficiency virus [HIV] disease: Secondary | ICD-10-CM

## 2012-05-13 DIAGNOSIS — Z21 Asymptomatic human immunodeficiency virus [HIV] infection status: Secondary | ICD-10-CM

## 2012-05-13 MED ORDER — EFAVIRENZ-EMTRICITAB-TENOFOVIR 600-200-300 MG PO TABS: 1.0000 | ORAL_TABLET | Freq: Every day | ORAL | Status: AC

## 2012-05-13 NOTE — Telephone Encounter (Signed)
Called #[phone] with 1 refill to [facility] in [place] and notified patient she will need to be seen by August 2013 there in [place] or will need an appt here in [place].  This is the last time we will be able to fill her medication without a visit. Wendall Mola CMA

## 2014-11-02 HISTORY — PX: OTHER SURGICAL HISTORY: SHX169

## 2016-12-31 HISTORY — PX: HARDWARE REMOVAL: SHX979

## 2017-10-18 DIAGNOSIS — B9734 Human T-cell lymphotrophic virus, type II [HTLV-II] as the cause of diseases classified elsewhere: Secondary | ICD-10-CM

## 2017-10-18 HISTORY — DX: Human T-cell lymphotrophic virus, type II (HTLV-II) as the cause of diseases classified elsewhere: B97.34

## 2017-11-17 ENCOUNTER — Encounter: Payer: Self-pay | Admitting: Behavioral Health

## 2017-11-17 ENCOUNTER — Ambulatory Visit (INDEPENDENT_AMBULATORY_CARE_PROVIDER_SITE_OTHER): Payer: 59 | Admitting: Infectious Disease

## 2017-11-17 ENCOUNTER — Other Ambulatory Visit: Payer: Self-pay

## 2017-11-17 VITALS — BP 121/80 | HR 76 | Ht 64.0 in | Wt 174.0 lb

## 2017-11-17 DIAGNOSIS — Z113 Encounter for screening for infections with a predominantly sexual mode of transmission: Secondary | ICD-10-CM | POA: Diagnosis not present

## 2017-11-17 DIAGNOSIS — Z23 Encounter for immunization: Secondary | ICD-10-CM

## 2017-11-17 DIAGNOSIS — Z79899 Other long term (current) drug therapy: Secondary | ICD-10-CM | POA: Diagnosis not present

## 2017-11-17 DIAGNOSIS — B20 Human immunodeficiency virus [HIV] disease: Secondary | ICD-10-CM | POA: Diagnosis not present

## 2017-11-17 MED ORDER — BICTEGRAVIR-EMTRICITAB-TENOFOV 50-200-25 MG PO TABS: 1.0000 | ORAL_TABLET | Freq: Every day | ORAL | 11 refills | Status: DC

## 2017-11-17 MED FILL — BIKTARVY 50-200-25 MG TABS: 50-200-25 | 30 days supply | Qty: 30 | Fill #0

## 2017-11-17 NOTE — Progress Notes (Signed)
[facility] for Infectious Disease Pharmacy Visit  HPI: Sue Mendez is a 52 y.o. female who presents to the [facility] clinic today to follow-up with Dr. Tommy Medal for her HIV infection.   Patient Active Problem List   Diagnosis Date Noted  . DERMATITIS DUE TO FOOD ALLERGY 10/29/2009  . CHEST PAIN, PLEURITIC 10/29/2009  . PYURIA 11/13/2008  . 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 DEPRESSION 12/19/2007  . FATIGUE 12/19/2007  . DYSPNEA ON EXERTION 12/19/2007  . 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 DYSURIA 06/20/2007  . HIV DISEASE 09/01/2006  . WART, LEFT HAND 09/01/2006  . VENEREAL WART 09/01/2006  . TONSILLITIS, ACUTE 09/01/2006  . ASTHMATIC BRONCHITIS, ACUTE 09/01/2006  . SINUSITIS, CHRONIC NEC 09/01/2006  . 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  Previous Medications   FLUCONAZOLE (DIFLUCAN) 150 MG TABLET    Take 150 mg by mouth daily.   METRONIDAZOLE (FLAGYL) 250 MG TABLET    Take 250 mg by mouth 3 (three) times daily.   TRAMADOL (ULTRAM) 50 MG TABLET    tramadol 50 mg tablet  Modified Medications   No medications on file  Discontinued Medications   EFAVIRENZ-EMTRICITABINE-TENOFOVIR (ATRIPLA) 600-200-300 MG TABLET    Take 1 tablet by mouth at bedtime.    Allergies: No Known Allergies  Past Medical History: Past Medical History:  Diagnosis Date  . 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 Marital status: Married    Spouse name: None  . Number of children: None  . Years of education: None  . Highest education level: None  Social Needs  . Financial resource strain: None  . Food insecurity - worry: None  . Food insecurity - inability: None  . Transportation needs - medical: None  . Transportation needs - non-medical: None  Occupational History   . None  Tobacco Use  . Smoking status: Former Smoker    Packs/day: 1.00    Types: Cigarettes    Last attempt to quit: 11/03/2011    Years since quitting: 6.0  . Smokeless tobacco: Never Used  Substance and Sexual Activity  . Alcohol use: No    Frequency: Never  . Drug use: No  . Sexual activity: Yes    Partners: Male    Birth control/protection: None    Comment: declined condoms  Other Topics Concern  . None  Social History Narrative  . None    Labs: HIV 1 RNA Quant (copies/mL)  Date Value  03/20/2010 <48 copies/mL  09/18/2009 169 (H)  10/19/2008 <48 copies/mL   CD4 T Cell Abs (cmm)  Date Value  03/20/2010 880  09/18/2009 810  10/19/2008 640   Hep B S Ab (no units)  Date Value  12/19/2007 POS (A)   Hepatitis B Surface Ag (no units)  Date Value  12/27/2006 No   HCV Ab (no units)  Date Value  12/27/2006 No    Lipids:    Component Value Date/Time   CHOL 151 02/02/2008 1948   TRIG 69 02/02/2008 1948   HDL 53 02/02/2008 1948   CHOLHDL 2.8 Ratio 02/02/2008 1948   VLDL 14 02/02/2008 1948   LDLCALC 84 02/02/2008 1948    Current HIV Regimen: Atripla  Assessment: Sue Mendez is here today to follow-up for her HIV infection.  She has been on Atripla for many,  many years.  She likes Atripla and is very hesitant to switch to another medication.  She has been getting it filled at [facility] and paying $2800 for her first fill of every year (deductible) and then subsequently paying $500-600 to meet her out of pocket max.  I explained to her that there are co-pay cards out there and she shouldn't have had to pay this if the specialty pharmacy would have helped her.  Dr. Tommy Medal and I tried talking her into switching to [facility] today. I went into detail about the difference between Atripla and Biktarvy and the benefits of TAF vs TDF and the side effects of Atripla (she has vivid dreams). She is still hesitant to switch.  She just got a 90 day supply of Atripla and  wishes not to waste the medication. We had Sue Mendez run it through at [facility] and they are able to fill it with $0 co-pay Systems developer). She wants [place] to go ahead and fill it. She will finish out her Atripla and then take the [place] for a month to see if she likes it. She will call me in a couple of months to tell me her decision.   Plan: - Finish out Atripla - Then start Biktarvy PO once daily - Mail from [facility] - Call me with decision  Seylah Wernert L. Zebediah Beezley, PharmD, [place], [place] [facility] for Infectious Disease 11/17/2017, 9:53 AM
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
## 2017-11-18 LAB — LIPID PANEL
CHOL/HDL RATIO: 3.6 (calc) (ref <5.0)
Cholesterol: 192 mg/dL (ref <200)
HDL: 54 mg/dL (ref >50)
LDL CHOLESTEROL (CALC): 117 mg/dL — AB
Non-HDL Cholesterol (Calc): 138 mg/dL (calc) — ABNORMAL HIGH (ref <130)
Triglycerides: 104 mg/dL (ref <150)

## 2017-11-18 LAB — COMPLETE METABOLIC PANEL WITH GFR
AG Ratio: 1.9 (calc) (ref 1.0–2.5)
ALBUMIN MSPROF: 4.5 g/dL (ref 3.6–5.1)
ALT: 10 U/L (ref 6–29)
AST: 15 U/L (ref 10–35)
Alkaline phosphatase (APISO): 104 U/L (ref 33–130)
BILIRUBIN TOTAL: 0.4 mg/dL (ref 0.2–1.2)
BUN: 12 mg/dL (ref 7–25)
CALCIUM: 9.4 mg/dL (ref 8.6–10.4)
CHLORIDE: 104 mmol/L (ref 98–110)
CO2: 28 mmol/L (ref 20–32)
CREATININE: 0.89 mg/dL (ref 0.50–1.05)
GFR, EST AFRICAN AMERICAN: 87 mL/min/{1.73_m2} (ref >=60)
GFR, Est Non African American: 75 mL/min/{1.73_m2} (ref >=60)
GLUCOSE: 75 mg/dL (ref 65–99)
Globulin: 2.4 g/dL (calc) (ref 1.9–3.7)
Potassium: 4.3 mmol/L (ref 3.5–5.3)
Sodium: 139 mmol/L (ref 135–146)
TOTAL PROTEIN: 6.9 g/dL (ref 6.1–8.1)

## 2017-11-18 LAB — CBC WITH DIFFERENTIAL/PLATELET
BASOS ABS: 67 {cells}/uL (ref 0–200)
Basophils Relative: 0.9 %
Eosinophils Absolute: 207 cells/uL (ref 15–500)
Eosinophils Relative: 2.8 %
HEMATOCRIT: 39.5 % (ref 35.0–45.0)
Hemoglobin: 13.5 g/dL (ref 11.7–15.5)
LYMPHS ABS: 2620 {cells}/uL (ref 850–3900)
MCH: 32.6 pg (ref 27.0–33.0)
MCHC: 34.2 g/dL (ref 32.0–36.0)
MCV: 95.4 fL (ref 80.0–100.0)
MPV: 9.3 fL (ref 7.5–12.5)
Monocytes Relative: 7.2 %
NEUTROS PCT: 53.7 %
Neutro Abs: 3974 cells/uL (ref 1500–7800)
Platelets: 280 10*3/uL (ref 140–400)
RBC: 4.14 10*6/uL (ref 3.80–5.10)
RDW: 11.7 % (ref 11.0–15.0)
TOTAL LYMPHOCYTE: 35.4 %
WBC: 7.4 10*3/uL (ref 3.8–10.8)
WBCMIX: 533 {cells}/uL (ref 200–950)

## 2017-11-18 LAB — URINE CYTOLOGY ANCILLARY ONLY
Chlamydia: NEGATIVE
Neisseria Gonorrhea: NEGATIVE

## 2017-11-18 LAB — RPR: RPR: NONREACTIVE

## 2017-11-18 LAB — T-HELPER CELL (CD4) - (RCID CLINIC ONLY)
CD4 % Helper T Cell: 34 % (ref 33–55)
CD4 T Cell Abs: 970 /uL (ref 400–2700)

## 2017-11-20 DIAGNOSIS — L500 Allergic urticaria: Secondary | ICD-10-CM | POA: Diagnosis not present

## 2017-11-22 ENCOUNTER — Telehealth: Payer: Self-pay | Admitting: Infectious Disease

## 2017-11-22 NOTE — Telephone Encounter (Signed)
Pt called having had HIVES due to detergent  She wanted to be sure ok to be on steroids which is fine  She had NOT made change to [facility] yet

## 2017-11-24 LAB — HIV RNA, RTPCR W/R GT (RTI, PI,INT)
HIV 1 RNA Quant: <20 copies/mL
HIV-1 RNA QUANT, LOG: NOT DETECTED {Log_copies}/mL

## 2017-12-06 ENCOUNTER — Encounter: Payer: Self-pay | Admitting: Behavioral Health

## 2017-12-10 ENCOUNTER — Telehealth: Payer: Self-pay | Admitting: Behavioral Health

## 2017-12-10 NOTE — Telephone Encounter (Signed)
Patient called and stated she has broken out in a rash for the third time in a month.  Patient describes the rash as small bumps on her arms and her feet and upper buttocks.  Patient states she thinks it is hives but states she has had Shingles in the past and thought it could be Shingles as well.  Patient states she did go to Urgent Care for the last rash and received a steroid injection with relief but states the rash has since reappeared. Writer advised patient to go to Urgent Care but she states she would like to come in for a visit with Dr. Tommy Medal.  Dr. Tommy Medal did not have any availability next week so she was set up with  An appointment with Janene Madeira NP 12/16/2017 at 9:30.  Patient states she has not started any new medications and states she has not done anything differently and does not know why she is breaking out.

## 2017-12-16 ENCOUNTER — Encounter: Payer: Self-pay | Admitting: Infectious Diseases

## 2017-12-16 ENCOUNTER — Other Ambulatory Visit: Payer: Self-pay

## 2017-12-16 ENCOUNTER — Ambulatory Visit (INDEPENDENT_AMBULATORY_CARE_PROVIDER_SITE_OTHER): Payer: 59 | Admitting: Infectious Diseases

## 2017-12-16 VITALS — BP 118/79 | HR 74 | Temp 98.3°F | Ht 63.0 in | Wt 178.0 lb

## 2017-12-16 DIAGNOSIS — L298 Other pruritus: Secondary | ICD-10-CM | POA: Diagnosis not present

## 2017-12-16 DIAGNOSIS — R21 Rash and other nonspecific skin eruption: Secondary | ICD-10-CM | POA: Diagnosis not present

## 2017-12-16 MED ORDER — PREDNISONE 10 MG PO TABS: 10.0000 mg | ORAL_TABLET | Freq: Every day | ORAL | 0 refills | Status: DC
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
## 2017-12-16 NOTE — Assessment & Plan Note (Signed)
Eruptions have appearance most consistent with urtica. They do not look classic for shingles, not present at previous location she had shingles in the past and are on multiple dermatomes on multiple areas of her body. I do not believe this has anything to do with her HIV. Not related to [facility] as she has not started this yet. It is still possible they are insect bites if she has ongoing exposure to them however being no one else in her home are experiencing these it makes it less likely.   There is old documentation on her chart about previous food-allergy dermatitis. I will give her a prednisone taper today in conjunction with Pepcid OTC and hydroxyzine/cetirizine for 1 week all together as I am most suspicious over allergy component. I am not certain if she is allergic to something with her bedding or if this is indeed food related but I have asked her to refrain from eating nuts or nut-products to see if this impacts frequency.   I have placed a referral for her to be evaluated with allergy specialist and dermatology should biopsy be needed to get more information about these lesions. Discussed symptoms that would warrant ER evaluation - if this is an allergic exposure and we are not certain as to the trigger high likelihood that she will continue with outbreaks until we can narrow down the cause. Advised to keep a diary of symptoms including any new environmental or food exposure in the 24h preceding these outbreaks.

## 2017-12-16 NOTE — Patient Instructions (Signed)
I am going to send in a prednisone taper for you. Take 6 pills on day 1, 5 pills on day 2, 4 pills on day 3, 3 pills on day 4, 2 pills on day 5 and 1 pill on day 6.   Would take the hydroxyzine for the itching. Do not combine this with zyrtec or benadryl as they do the same thing.   I would also take over the counter Pepcid for 1 week to see if this will help. Separate from HIV medication by 6 hours if you can.   We will get you in to see dermatology and an allergist. In the mean time avoid all nut products.

## 2017-12-21 ENCOUNTER — Emergency Department (HOSPITAL_BASED_OUTPATIENT_CLINIC_OR_DEPARTMENT_OTHER)
Admission: EM | Admit: 2017-12-21 | Discharge: 2017-12-22 | Disposition: A | Discharge status: Home / Self Care | Payer: 59 | Attending: Emergency Medicine | Admitting: Emergency Medicine

## 2017-12-21 ENCOUNTER — Encounter (HOSPITAL_BASED_OUTPATIENT_CLINIC_OR_DEPARTMENT_OTHER): Payer: Self-pay

## 2017-12-21 ENCOUNTER — Other Ambulatory Visit: Payer: Self-pay

## 2017-12-21 DIAGNOSIS — R21 Rash and other nonspecific skin eruption: Secondary | ICD-10-CM | POA: Diagnosis not present

## 2017-12-21 DIAGNOSIS — R0602 Shortness of breath: Secondary | ICD-10-CM | POA: Insufficient documentation

## 2017-12-21 DIAGNOSIS — T7840XA Allergy, unspecified, initial encounter: Secondary | ICD-10-CM | POA: Diagnosis not present

## 2017-12-21 NOTE — ED Notes (Signed)
Pt was seen by her infectious disease MD 2/14 for same.

## 2017-12-21 NOTE — ED Triage Notes (Signed)
Pt c/o chronic hives and tonight she started having subjective SOB since about 2000, worsening since 2230.  Did not take any benadryl prior to arrival.  Pt's lungs are clear, no stridor in throat, pt keeps clearing her throat in triage though.

## 2017-12-22 ENCOUNTER — Encounter: Payer: Self-pay | Admitting: Infectious Disease

## 2017-12-22 MED ORDER — ALBUTEROL SULFATE HFA 108 (90 BASE) MCG/ACT IN AERS
2.0000 | INHALATION_SPRAY | RESPIRATORY_TRACT | Status: DC | PRN
  Administered 2017-12-22: 2 via RESPIRATORY_TRACT
  Filled 2017-12-22: qty 6.7

## 2017-12-22 MED ORDER — EPINEPHRINE 0.3 MG/0.3ML IJ SOAJ: 0.3000 mg | Freq: Once | INTRAMUSCULAR | 2 refills | Status: AC
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
## 2017-12-22 NOTE — Discharge Instructions (Signed)
Take Claritin daily.  For tomorrow, Take Benadryl 1 tablet every 6 hours.

## 2017-12-23 DIAGNOSIS — L509 Urticaria, unspecified: Secondary | ICD-10-CM | POA: Diagnosis not present

## 2017-12-23 DIAGNOSIS — D2371 Other benign neoplasm of skin of right lower limb, including hip: Secondary | ICD-10-CM | POA: Diagnosis not present

## 2017-12-23 DIAGNOSIS — C44519 Basal cell carcinoma of skin of other part of trunk: Secondary | ICD-10-CM | POA: Diagnosis not present

## 2017-12-29 DIAGNOSIS — C44519 Basal cell carcinoma of skin of other part of trunk: Secondary | ICD-10-CM | POA: Diagnosis not present

## 2017-12-29 DIAGNOSIS — L570 Actinic keratosis: Secondary | ICD-10-CM | POA: Diagnosis not present

## 2018-01-06 ENCOUNTER — Other Ambulatory Visit: Payer: Self-pay | Admitting: Pharmacist Clinician (PhC)/ Clinical Pharmacy Specialist

## 2018-01-14 DIAGNOSIS — J309 Allergic rhinitis, unspecified: Secondary | ICD-10-CM | POA: Diagnosis not present

## 2018-01-14 DIAGNOSIS — R05 Cough: Secondary | ICD-10-CM | POA: Diagnosis not present

## 2018-01-14 DIAGNOSIS — R21 Rash and other nonspecific skin eruption: Secondary | ICD-10-CM | POA: Diagnosis not present

## 2018-03-10 ENCOUNTER — Other Ambulatory Visit: Payer: Self-pay | Admitting: Pharmacist

## 2018-03-15 ENCOUNTER — Other Ambulatory Visit: Payer: 59

## 2018-03-15 DIAGNOSIS — B20 Human immunodeficiency virus [HIV] disease: Secondary | ICD-10-CM

## 2018-03-15 LAB — COMPLETE METABOLIC PANEL WITH GFR
AG RATIO: 2 (calc) (ref 1.0–2.5)
ALT: 11 U/L (ref 6–29)
AST: 16 U/L (ref 10–35)
Albumin: 4.3 g/dL (ref 3.6–5.1)
Alkaline phosphatase (APISO): 89 U/L (ref 33–130)
BUN: 13 mg/dL (ref 7–25)
CALCIUM: 9.4 mg/dL (ref 8.6–10.4)
CHLORIDE: 106 mmol/L (ref 98–110)
CO2: 29 mmol/L (ref 20–32)
Creat: 1.02 mg/dL (ref 0.50–1.05)
GFR, EST AFRICAN AMERICAN: 74 mL/min/{1.73_m2} (ref >=60)
GFR, EST NON AFRICAN AMERICAN: 64 mL/min/{1.73_m2} (ref >=60)
GLOBULIN: 2.2 g/dL (ref 1.9–3.7)
Glucose, Bld: 103 mg/dL — ABNORMAL HIGH (ref 65–99)
POTASSIUM: 4.4 mmol/L (ref 3.5–5.3)
SODIUM: 142 mmol/L (ref 135–146)
TOTAL PROTEIN: 6.5 g/dL (ref 6.1–8.1)
Total Bilirubin: 0.6 mg/dL (ref 0.2–1.2)

## 2018-03-15 LAB — CBC WITH DIFFERENTIAL/PLATELET
BASOS ABS: 50 {cells}/uL (ref 0–200)
Basophils Relative: 0.8 %
EOS ABS: 120 {cells}/uL (ref 15–500)
EOS PCT: 1.9 %
HCT: 36.9 % (ref 35.0–45.0)
Hemoglobin: 12.5 g/dL (ref 11.7–15.5)
Lymphs Abs: 2148 cells/uL (ref 850–3900)
MCH: 32.3 pg (ref 27.0–33.0)
MCHC: 33.9 g/dL (ref 32.0–36.0)
MCV: 95.3 fL (ref 80.0–100.0)
MONOS PCT: 6.5 %
MPV: 9.3 fL (ref 7.5–12.5)
Neutro Abs: 3572 cells/uL (ref 1500–7800)
Neutrophils Relative %: 56.7 %
PLATELETS: 291 10*3/uL (ref 140–400)
RBC: 3.87 10*6/uL (ref 3.80–5.10)
RDW: 11.5 % (ref 11.0–15.0)
TOTAL LYMPHOCYTE: 34.1 %
WBC mixed population: 410 cells/uL (ref 200–950)
WBC: 6.3 10*3/uL (ref 3.8–10.8)

## 2018-03-16 LAB — T-HELPER CELL (CD4) - (RCID CLINIC ONLY)
CD4 T CELL ABS: 870 /uL (ref 400–2700)
CD4 T CELL HELPER: 37 % (ref 33–55)

## 2018-03-17 LAB — HIV-1 RNA QUANT-NO REFLEX-BLD
HIV 1 RNA Quant: <20 copies/mL
HIV-1 RNA Quant, Log: <1.3 Log copies/mL

## 2018-03-24 MED FILL — BIKTARVY 50-200-25 MG TABS: 50-200-25 | 30 days supply | Qty: 30 | Fill #1

## 2018-03-30 ENCOUNTER — Encounter: Payer: Self-pay | Admitting: Infectious Disease

## 2018-03-30 ENCOUNTER — Ambulatory Visit (INDEPENDENT_AMBULATORY_CARE_PROVIDER_SITE_OTHER): Payer: 59 | Admitting: Infectious Disease

## 2018-03-30 VITALS — BP 107/73 | HR 71 | Temp 97.7°F | Ht 64.0 in | Wt 180.0 lb

## 2018-03-30 DIAGNOSIS — Z113 Encounter for screening for infections with a predominantly sexual mode of transmission: Secondary | ICD-10-CM | POA: Diagnosis not present

## 2018-03-30 DIAGNOSIS — K769 Liver disease, unspecified: Secondary | ICD-10-CM

## 2018-03-30 DIAGNOSIS — Z79899 Other long term (current) drug therapy: Secondary | ICD-10-CM | POA: Diagnosis not present

## 2018-03-30 DIAGNOSIS — B20 Human immunodeficiency virus [HIV] disease: Secondary | ICD-10-CM | POA: Diagnosis not present

## 2018-03-30 DIAGNOSIS — Z23 Encounter for immunization: Secondary | ICD-10-CM | POA: Diagnosis not present
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
## 2018-04-22 MED FILL — BIKTARVY 50-200-25 MG TABS: 50-200-25 | 30 days supply | Qty: 30 | Fill #2

## 2018-05-06 DIAGNOSIS — D2371 Other benign neoplasm of skin of right lower limb, including hip: Secondary | ICD-10-CM | POA: Diagnosis not present

## 2018-05-06 DIAGNOSIS — L570 Actinic keratosis: Secondary | ICD-10-CM | POA: Diagnosis not present

## 2018-05-25 MED FILL — BIKTARVY 50-200-25 MG TABS: 50-200-25 | 30 days supply | Qty: 30 | Fill #3

## 2018-06-20 MED FILL — BIKTARVY 50-200-25 MG TABS: 50-200-25 | 30 days supply | Qty: 30 | Fill #4

## 2018-07-20 MED FILL — BIKTARVY 50-200-25 MG TABS: 50-200-25 | 30 days supply | Qty: 30 | Fill #5

## 2018-07-21 DIAGNOSIS — B9734 Human T-cell lymphotrophic virus, type II [HTLV-II] as the cause of diseases classified elsewhere: Secondary | ICD-10-CM | POA: Diagnosis not present

## 2018-07-29 ENCOUNTER — Other Ambulatory Visit: Payer: Self-pay | Admitting: Obstetrics and Gynecology

## 2018-07-29 DIAGNOSIS — N9089 Other specified noninflammatory disorders of vulva and perineum: Secondary | ICD-10-CM

## 2018-08-01 ENCOUNTER — Other Ambulatory Visit: Payer: Self-pay

## 2018-08-01 ENCOUNTER — Encounter (HOSPITAL_COMMUNITY): Payer: Self-pay | Admitting: *Deleted

## 2018-08-02 ENCOUNTER — Encounter (HOSPITAL_COMMUNITY): Payer: Self-pay | Admitting: Obstetrics and Gynecology

## 2018-08-02 DIAGNOSIS — N9089 Other specified noninflammatory disorders of vulva and perineum: Secondary | ICD-10-CM | POA: Insufficient documentation
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
## 2018-08-03 ENCOUNTER — Ambulatory Visit (HOSPITAL_COMMUNITY): Payer: 59 | Admitting: Anesthesiology

## 2018-08-03 ENCOUNTER — Encounter (HOSPITAL_COMMUNITY)
Admission: RE | Disposition: A | Discharge status: Home / Self Care | Payer: Self-pay | Source: Ambulatory Visit | Attending: Obstetrics and Gynecology

## 2018-08-03 ENCOUNTER — Other Ambulatory Visit: Payer: Self-pay

## 2018-08-03 ENCOUNTER — Encounter (HOSPITAL_COMMUNITY): Payer: Self-pay | Admitting: *Deleted

## 2018-08-03 ENCOUNTER — Ambulatory Visit (HOSPITAL_COMMUNITY)
Admission: RE | Admit: 2018-08-03 | Discharge: 2018-08-03 | Disposition: A | Discharge status: Home / Self Care | Payer: 59 | Source: Ambulatory Visit | Attending: Obstetrics and Gynecology | Admitting: Obstetrics and Gynecology

## 2018-08-03 DIAGNOSIS — Z85828 Personal history of other malignant neoplasm of skin: Secondary | ICD-10-CM | POA: Insufficient documentation

## 2018-08-03 DIAGNOSIS — Z6833 Body mass index (BMI) 33.0-33.9, adult: Secondary | ICD-10-CM | POA: Insufficient documentation

## 2018-08-03 DIAGNOSIS — E669 Obesity, unspecified: Secondary | ICD-10-CM | POA: Diagnosis not present

## 2018-08-03 DIAGNOSIS — F329 Major depressive disorder, single episode, unspecified: Secondary | ICD-10-CM | POA: Insufficient documentation

## 2018-08-03 DIAGNOSIS — Z21 Asymptomatic human immunodeficiency virus [HIV] infection status: Secondary | ICD-10-CM | POA: Diagnosis not present

## 2018-08-03 DIAGNOSIS — N901 Moderate vulvar dysplasia: Secondary | ICD-10-CM | POA: Diagnosis present

## 2018-08-03 DIAGNOSIS — N900 Mild vulvar dysplasia: Secondary | ICD-10-CM | POA: Insufficient documentation

## 2018-08-03 DIAGNOSIS — Z87891 Personal history of nicotine dependence: Secondary | ICD-10-CM | POA: Diagnosis not present

## 2018-08-03 DIAGNOSIS — N9089 Other specified noninflammatory disorders of vulva and perineum: Secondary | ICD-10-CM | POA: Diagnosis not present

## 2018-08-03 DIAGNOSIS — D071 Carcinoma in situ of vulva: Secondary | ICD-10-CM | POA: Insufficient documentation

## 2018-08-03 DIAGNOSIS — Z90710 Acquired absence of both cervix and uterus: Secondary | ICD-10-CM | POA: Insufficient documentation

## 2018-08-03 DIAGNOSIS — Z79899 Other long term (current) drug therapy: Secondary | ICD-10-CM | POA: Diagnosis not present

## 2018-08-03 HISTORY — DX: Presence of dental prosthetic device (complete) (partial): Z97.2

## 2018-08-03 HISTORY — DX: Fracture of one rib, unspecified side, initial encounter for closed fracture: S22.39XA

## 2018-08-03 HISTORY — DX: Herpesviral infection, unspecified: B00.9

## 2018-08-03 HISTORY — PX: VULVECTOMY: SHX1086

## 2018-08-03 LAB — BASIC METABOLIC PANEL
Anion gap: 6 (ref 5–15)
BUN: 20 mg/dL (ref 6–20)
CHLORIDE: 106 mmol/L (ref 98–111)
CO2: 26 mmol/L (ref 22–32)
Calcium: 8.9 mg/dL (ref 8.9–10.3)
Creatinine, Ser: 1 mg/dL (ref 0.44–1.00)
GFR calc Af Amer: >60 mL/min (ref >60)
GLUCOSE: 99 mg/dL (ref 70–99)
POTASSIUM: 4 mmol/L (ref 3.5–5.1)
Sodium: 138 mmol/L (ref 135–145)

## 2018-08-03 LAB — CBC
HCT: 38.5 % (ref 36.0–46.0)
Hemoglobin: 12.9 g/dL (ref 12.0–15.0)
MCH: 32.2 pg (ref 26.0–34.0)
MCHC: 33.5 g/dL (ref 30.0–36.0)
MCV: 96 fL (ref 78.0–100.0)
Platelets: 258 10*3/uL (ref 150–400)
RBC: 4.01 MIL/uL (ref 3.87–5.11)
RDW: 12.2 % (ref 11.5–15.5)
WBC: 6.2 10*3/uL (ref 4.0–10.5)

## 2018-08-03 SURGERY — WIDE EXCISION VULVECTOMY
Anesthesia: General | Laterality: Bilateral

## 2018-08-03 MED ORDER — LIDOCAINE HCL (CARDIAC) PF 100 MG/5ML IV SOSY
PREFILLED_SYRINGE | INTRAVENOUS | Status: DC | PRN
  Administered 2018-08-03: 50 mg via INTRAVENOUS

## 2018-08-03 MED ORDER — FENTANYL CITRATE (PF) 100 MCG/2ML IJ SOLN
25.0000 ug | INTRAMUSCULAR | Status: DC | PRN
  Administered 2018-08-03: 50 ug via INTRAVENOUS

## 2018-08-03 MED ORDER — IBUPROFEN 600 MG PO TABS: ORAL_TABLET | ORAL | 0 refills | Status: DC

## 2018-08-03 MED ORDER — EPHEDRINE 5 MG/ML INJ
INTRAVENOUS | Status: AC
  Filled 2018-08-03: qty 10

## 2018-08-03 MED ORDER — BUPIVACAINE-EPINEPHRINE (PF) 0.5% -1:200000 IJ SOLN
INTRAMUSCULAR | Status: DC | PRN
  Administered 2018-08-03: 10 mL via PERINEURAL

## 2018-08-03 MED ORDER — EPHEDRINE SULFATE 50 MG/ML IJ SOLN
INTRAMUSCULAR | Status: DC | PRN
  Administered 2018-08-03 (×4): 10 mg via INTRAVENOUS

## 2018-08-03 MED ORDER — MIDAZOLAM HCL 2 MG/2ML IJ SOLN
INTRAMUSCULAR | Status: AC
  Filled 2018-08-03: qty 2

## 2018-08-03 MED ORDER — DEXAMETHASONE SODIUM PHOSPHATE 4 MG/ML IJ SOLN
INTRAMUSCULAR | Status: AC
  Filled 2018-08-03: qty 1

## 2018-08-03 MED ORDER — PROPOFOL 10 MG/ML IV BOLUS
INTRAVENOUS | Status: AC
  Filled 2018-08-03: qty 20

## 2018-08-03 MED ORDER — SCOPOLAMINE 1 MG/3DAYS TD PT72
1.0000 | MEDICATED_PATCH | Freq: Once | TRANSDERMAL | Status: DC
  Administered 2018-08-03: 1.5 mg via TRANSDERMAL

## 2018-08-03 MED ORDER — HYDROCODONE-ACETAMINOPHEN 7.5-325 MG PO TABS: 1.0000 | ORAL_TABLET | Freq: Once | ORAL | Status: DC | PRN

## 2018-08-03 MED ORDER — DEXAMETHASONE SODIUM PHOSPHATE 10 MG/ML IJ SOLN
INTRAMUSCULAR | Status: DC | PRN
  Administered 2018-08-03: 4 mg via INTRAVENOUS

## 2018-08-03 MED ORDER — KETOROLAC TROMETHAMINE 30 MG/ML IJ SOLN
INTRAMUSCULAR | Status: AC
  Filled 2018-08-03: qty 1

## 2018-08-03 MED ORDER — ONDANSETRON HCL 4 MG/2ML IJ SOLN: 4.0000 mg | Freq: Once | INTRAMUSCULAR | Status: DC | PRN

## 2018-08-03 MED ORDER — LACTATED RINGERS IV SOLN
INTRAVENOUS | Status: DC
  Administered 2018-08-03 (×2): via INTRAVENOUS

## 2018-08-03 MED ORDER — BUPIVACAINE-EPINEPHRINE (PF) 0.5% -1:200000 IJ SOLN
INTRAMUSCULAR | Status: AC
  Filled 2018-08-03: qty 30

## 2018-08-03 MED ORDER — LIDOCAINE HCL 1 % IJ SOLN
INTRAMUSCULAR | Status: AC
  Filled 2018-08-03: qty 20

## 2018-08-03 MED ORDER — MIDAZOLAM HCL 2 MG/2ML IJ SOLN
INTRAMUSCULAR | Status: DC | PRN
  Administered 2018-08-03: 2 mg via INTRAVENOUS

## 2018-08-03 MED ORDER — ONDANSETRON HCL 4 MG/2ML IJ SOLN
INTRAMUSCULAR | Status: AC
  Filled 2018-08-03: qty 2

## 2018-08-03 MED ORDER — ONDANSETRON HCL 4 MG/2ML IJ SOLN
INTRAMUSCULAR | Status: DC | PRN
  Administered 2018-08-03: 4 mg via INTRAVENOUS

## 2018-08-03 MED ORDER — MEPERIDINE HCL 25 MG/ML IJ SOLN: 6.2500 mg | INTRAMUSCULAR | Status: DC | PRN

## 2018-08-03 MED ORDER — KETOROLAC TROMETHAMINE 30 MG/ML IJ SOLN
INTRAMUSCULAR | Status: DC | PRN
  Administered 2018-08-03: 30 mg via INTRAVENOUS

## 2018-08-03 MED ORDER — FENTANYL CITRATE (PF) 100 MCG/2ML IJ SOLN
INTRAMUSCULAR | Status: AC
  Filled 2018-08-03: qty 2

## 2018-08-03 MED ORDER — PROPOFOL 10 MG/ML IV BOLUS
INTRAVENOUS | Status: DC | PRN
  Administered 2018-08-03: 200 mg via INTRAVENOUS

## 2018-08-03 MED ORDER — FENTANYL CITRATE (PF) 100 MCG/2ML IJ SOLN
INTRAMUSCULAR | Status: DC | PRN
  Administered 2018-08-03 (×2): 50 ug via INTRAVENOUS

## 2018-08-03 MED ORDER — SCOPOLAMINE 1 MG/3DAYS TD PT72
MEDICATED_PATCH | TRANSDERMAL | Status: AC
  Filled 2018-08-03: qty 1

## 2018-08-03 MED ORDER — LIDOCAINE HCL (CARDIAC) PF 100 MG/5ML IV SOSY
PREFILLED_SYRINGE | INTRAVENOUS | Status: AC
  Filled 2018-08-03: qty 5

## 2018-08-03 SURGICAL SUPPLY — 25 items
BLADE SURG 15 STRL LF C SS BP (BLADE) ×2 IMPLANT
BLADE SURG 15 STRL SS (BLADE) ×4
ELECT REM PT RETURN 9FT ADLT (ELECTROSURGICAL) ×4
ELECTRODE REM PT RTRN 9FT ADLT (ELECTROSURGICAL) ×1 IMPLANT
GLOVE BIO SURGEON STRL SZ 6.5 (GLOVE) ×3 IMPLANT
GLOVE BIO SURGEONS STRL SZ 6.5 (GLOVE) ×1
GLOVE BIOGEL PI IND STRL 7.0 (GLOVE) ×4 IMPLANT
GLOVE BIOGEL PI INDICATOR 7.0 (GLOVE) ×4
GOWN STRL REUS W/TWL LRG LVL3 (GOWN DISPOSABLE) ×8 IMPLANT
NEEDLE HYPO 22GX1.5 SAFETY (NEEDLE) ×4 IMPLANT
NS IRRIG 1000ML POUR BTL (IV SOLUTION) ×4 IMPLANT
PACK VAGINAL MINOR WOMEN LF (CUSTOM PROCEDURE TRAY) ×4 IMPLANT
PAD OB MATERNITY 4.3X12.25 (PERSONAL CARE ITEMS) ×4 IMPLANT
PAD PREP 24X48 CUFFED NSTRL (MISCELLANEOUS) ×4 IMPLANT
PENCIL BUTTON HOLSTER BLD 10FT (ELECTRODE) ×3 IMPLANT
SUT VIC AB 2-0 SH 27 (SUTURE)
SUT VIC AB 2-0 SH 27XBRD (SUTURE) IMPLANT
SUT VIC AB 3-0 SH 27 (SUTURE) ×4
SUT VIC AB 3-0 SH 27X BRD (SUTURE) ×1 IMPLANT
SUT VIC AB 4-0 SH 27 (SUTURE) ×8
SUT VIC AB 4-0 SH 27XANBCTRL (SUTURE) ×2 IMPLANT
TOWEL OR 17X24 6PK STRL BLUE (TOWEL DISPOSABLE) ×8 IMPLANT
TUBING NON-CON 1/4 X 20 CONN (TUBING) IMPLANT
TUBING NON-CON 1/4 X 20' CONN (TUBING)
YANKAUER SUCT BULB TIP NO VENT (SUCTIONS) IMPLANT

## 2018-08-03 NOTE — Transfer of Care (Signed)
Immediate Anesthesia Transfer of Care Note  Patient: Sue Mendez  Procedure(s) Performed: WIDE EXCISION VULVECTOMY (Bilateral )  Patient Location: PACU  Anesthesia Type:General  Level of Consciousness: awake, alert  and oriented  Airway & Oxygen Therapy: Patient Spontanous Breathing and Patient connected to nasal cannula oxygen  Post-op Assessment: Report given to RN and Post -op Vital signs reviewed and stable  Post vital signs: Reviewed and stable  Last Vitals:  Vitals Value Taken Time  BP 120/75 08/03/2018 10:31 AM  Temp    Pulse 88 08/03/2018 10:33 AM  Resp 18 08/03/2018 10:33 AM  SpO2 93 % 08/03/2018 10:33 AM  Vitals shown include unvalidated device data.  Last Pain:  Vitals:   08/03/18 0747  TempSrc: Oral  PainSc: 2       Patients Stated Pain Goal: 3 (41/79/19 9579)  Complications: No apparent anesthesia complications

## 2018-08-03 NOTE — Discharge Instructions (Signed)
Maintain ice packs on the surgical area over the first 24 hours.  Use ice packs as needed thereafter for comfort. Use a squeeze bottle of warm water over the perineum with each void to dilute the urine and decrease the risk of a burning sensation  from the urine. Used a different squeeze bottle with 1 tablespoon of table salt to 1 quart of warm water to rinse the perineal area after toileting with their urination or bowel movement. You may shower as usual after 24 hours. Refrain from intercourse until your postoperative visit   Post Anesthesia Home Care Instructions  NO IBUPROFEN PRODUCTS UNTIL: 3:15 PM TODAY  Activity: Get plenty of rest for the remainder of the day. A responsible individual must stay with you for 24 hours following the procedure.  For the next 24 hours, DO NOT: -Drive a car -Paediatric nurse -Drink alcoholic beverages -Take any medication unless instructed by your physician -Make any legal decisions or sign important papers.  Meals: Start with liquid foods such as gelatin or soup. Progress to regular foods as tolerated. Avoid greasy, spicy, heavy foods. If nausea and/or vomiting occur, drink only clear liquids until the nausea and/or vomiting subsides. Call your physician if vomiting continues.  Special Instructions/Symptoms: Your throat may feel dry or sore from the anesthesia or the breathing tube placed in your throat during surgery. If this causes discomfort, gargle with warm salt water. The discomfort should disappear within 24 hours.  If you had a scopolamine patch placed behind your ear for the management of post- operative nausea and/or vomiting:  1. The medication in the patch is effective for 72 hours, after which it should be removed.  Wrap patch in a tissue and discard in the trash. Wash hands thoroughly with soap and water. 2. You may remove the patch earlier than 72 hours if you experience unpleasant side effects which may include dry mouth, dizziness  or visual disturbances. 3. Avoid touching the patch. Wash your hands with soap and water after contact with the patch.

## 2018-08-03 NOTE — Anesthesia Procedure Notes (Signed)
Procedure Name: LMA Insertion Date/Time: 08/03/2018 9:16 AM Performed by: Bufford Spikes, CRNA Pre-anesthesia Checklist: Patient identified, Emergency Drugs available, Suction available and Patient being monitored Patient Re-evaluated:Patient Re-evaluated prior to induction Oxygen Delivery Method: Circle system utilized Preoxygenation: Pre-oxygenation with 100% oxygen Induction Type: IV induction Ventilation: Mask ventilation without difficulty LMA: LMA inserted LMA Size: 4.0 Number of attempts: 1 Airway Equipment and Method: Bite block Placement Confirmation: positive ETCO2 Tube secured with: Tape Dental Injury: Teeth and Oropharynx as per pre-operative assessment

## 2018-08-03 NOTE — Anesthesia Preprocedure Evaluation (Addendum)
Anesthesia Evaluation  Patient identified by MRN, date of birth, ID band Patient awake    Reviewed: Allergy & Precautions, NPO status , Patient's Chart, lab work & pertinent test results  Airway Mallampati: II  TM Distance: >3 FB Neck ROM: Full    Dental  (+) Upper Dentures   Pulmonary former smoker,    Pulmonary exam normal breath sounds clear to auscultation       Cardiovascular negative cardio ROS Normal cardiovascular exam Rhythm:Regular Rate:Normal     Neuro/Psych PSYCHIATRIC DISORDERS Depression    GI/Hepatic negative GI ROS, Neg liver ROS,   Endo/Other  Obesity  Renal/GU negative Renal ROS     Musculoskeletal negative musculoskeletal ROS (+)   Abdominal (+) + obese,   Peds  Hematology  (+) HIV,   Anesthesia Other Findings   Reproductive/Obstetrics Vulvar lesion HSV                            Anesthesia Physical Anesthesia Plan  ASA: II  Anesthesia Plan: General   Post-op Pain Management:    Induction: Intravenous  PONV Risk Score and Plan: 4 or greater and Scopolamine patch - Pre-op, Ondansetron, Dexamethasone, Midazolam and Treatment may vary due to age or medical condition  Airway Management Planned: LMA  Additional Equipment:   Intra-op Plan:   Post-operative Plan: Extubation in OR  Informed Consent: I have reviewed the patients History and Physical, chart, labs and discussed the procedure including the risks, benefits and alternatives for the proposed anesthesia with the patient or authorized representative who has indicated his/her understanding and acceptance.   Dental advisory given  Plan Discussed with: CRNA and Surgeon  Anesthesia Plan Comments:         Anesthesia Quick Evaluation

## 2018-08-03 NOTE — Op Note (Signed)
Procedure(s): WIDE LOCAL  EXCISION OF VULVAR LESION/ PARTIAL VULVECTOMY Procedure Note  Sue Mendez female 52 y.o. 08/03/2018  Procedure(s) and Anesthesia Type: WIDE LOCAL  EXCISION OF VULVAR LESION/ PARTIAL VULVECTOMY  Surgeon(s) and Role:    * Eriyonna Matsushita, Seymour Bars, MD - Primary   Indications: The patient presents for excision of bilateral ulcerative lesions of the vulva.  The lesion of the right vulva has been present for approximately 1 year without adequate healing and with severe pain upon palpation.  The lesion on the left vulva has arisen over the last several weeks.  Per patient history her previous gynecologist performed negative cultures and a biopsy showing benign lesion only.  The patient wishes to have the lesion completely excised both for diagnosis and for improved healing.    Surgeon: Eldred Manges   Assistants: None  Anesthesia: General endotracheal anesthesia  ASA Class: 3    Procedure Detail  WIDE LOCAL  EXCISION OF VULVAR LESION/ PARTIAL VULVECTOMY  Findings: the right vulvar lesion is located on the inner aspect of the labia minora measuring approximately the 20 mm x 5 mm.  The lesion is ulcerated and indented with raised edges and a clear surface internally which is tender to touch.  The lesions on the left are located on the outer surface of the labia minora and the inner surface of the labia majora directly adjacent to one another.  They measure 2 to 3 mm and are slightly raised with an erythematous vascular center surrounded by a course her white collar.  The center bleeds easily upon friction.  Photographs are used to document the lesions. Estimated Blood Loss:  Minimal         Drains: None        Blood Given: none          Specimens: Right vulvar lesion with a stitch at 6:00.  2 left vulvar lesions         Implants: none        Complications: None         Disposition: PACU - hemodynamically stable.         Condition: stable  Procedure:  The patient was taken to the operating room after appropriate identification and placed on the operating table.  After the attainment of adequate general anesthesia she was placed in the lithotomy position.  A timeout was performed.  The perineum was prepped with multiple layers of Betadine.  Local anesthetic infiltration of each of the aforementioned lesions was undertaken with 0.5% Marcaine with epinephrine on the right and left some bites.  A total of 10 cc was used.  The right vulvar lesion was outlined elevating the mucosa from its underlying sub-mucosal tissues and skinning the mucosa off being careful to leave the mucosa intact in the specimen.  A suture was placed at the 6 o'clock position on the specimen and it was removed from the operative field.  The excision bed was then closed with interrupted figure-of-eight sutures of 4-0 Vicryl.  A similar procedure was carried out on the left side with the 2 small left vulvar lesions each being circumscribed sharply and elevated removing the mucosa intact and excising to small lesions that were sent for pathologic evaluation.  A subcuticular suture was used to close the defect for each of these removals.  Hemostasis was noted to be adequate and all instruments were removed from the operative field.  Patient was awakened from general anesthesia and taken to the recovery room  in satisfactory condition having tolerated the procedure well with sponge and instrument counts correct.    Pharmacologic VTE prophylaxis was not used because of the risk of bleeding and the brevity of the procedure.  Early ambulation is expected.

## 2018-08-03 NOTE — Anesthesia Postprocedure Evaluation (Signed)
Anesthesia Post Note  Patient: Sue Mendez  Procedure(s) Performed: WIDE EXCISION VULVECTOMY (Bilateral )     Patient location during evaluation: PACU Anesthesia Type: General Level of consciousness: awake and alert and oriented Pain management: pain level controlled Vital Signs Assessment: post-procedure vital signs reviewed and stable Respiratory status: spontaneous breathing, nonlabored ventilation and respiratory function stable Cardiovascular status: blood pressure returned to baseline and stable Postop Assessment: no apparent nausea or vomiting Anesthetic complications: no    Last Vitals:  Vitals:   08/03/18 1115 08/03/18 1130  BP:    Pulse: 65   Resp: 14   Temp:  36.7 C  SpO2: 94%     Last Pain:  Vitals:   08/03/18 1115  TempSrc:   PainSc: 0-No pain   Pain Goal: Patients Stated Pain Goal: 3 (08/03/18 1115)               Gwynneth Fabio A.

## 2018-08-04 ENCOUNTER — Encounter (HOSPITAL_COMMUNITY): Payer: Self-pay | Admitting: Obstetrics and Gynecology

## 2018-08-12 ENCOUNTER — Telehealth: Payer: Self-pay | Admitting: *Deleted

## 2018-08-12 NOTE — Telephone Encounter (Signed)
Lorri from [facility] called, gave her the appt for the new patient referral. She will give the patient the appt and instructions

## 2018-08-17 ENCOUNTER — Encounter: Payer: Self-pay | Admitting: Obstetrics

## 2018-08-17 ENCOUNTER — Inpatient Hospital Stay: Payer: 59 | Attending: Obstetrics | Admitting: Obstetrics

## 2018-08-17 ENCOUNTER — Ambulatory Visit: Payer: 59 | Admitting: Obstetrics

## 2018-08-17 VITALS — BP 124/80 | HR 66 | Temp 98.0°F | Resp 20 | Ht 63.0 in | Wt 188.4 lb

## 2018-08-17 DIAGNOSIS — N901 Moderate vulvar dysplasia: Secondary | ICD-10-CM | POA: Insufficient documentation

## 2018-08-17 DIAGNOSIS — R1907 Generalized intra-abdominal and pelvic swelling, mass and lump: Secondary | ICD-10-CM | POA: Diagnosis not present

## 2018-08-17 DIAGNOSIS — B20 Human immunodeficiency virus [HIV] disease: Secondary | ICD-10-CM

## 2018-08-17 NOTE — Patient Instructions (Signed)
Plan to have a partial vulvectomy at the [facility] on August 31, 2018.  You will receive a phone call from the pre-surgical RN to discuss instructions.  Please call for any questions or concerns.   Vulvectomy  Vulvectomy is a surgical procedure to remove all or part of the outer female genital organs (vulva). The vulva includes the outer and inner lips of the vagina and the clitoris. You may need this surgery if you have a cancerous growth in your vulva. There are two types of vulvectomy:  A simple vulvectomy. This is the removal of the entire vulva.  A radical vulvectomy. A radical vulvectomy can be partial or complete. ? A partial radical vulvectomy is when part of the vulva and surrounding deep tissue is removed. ? A complete radical vulvectomy is when the vulva, clitoris, and surrounding deep tissue is removed.  During a radical vulvectomy, some lymph nodes near the vulva may also be removed. Tell a health care provider about:  Any allergies you have.  All medicines you are taking, including vitamins, herbs, eye drops, creams, and over-the-counter medicines.  Any problems you or family members have had with anesthetic medicines.  Any blood disorders you have.  Any surgeries you have had.  Any medical conditions you have.  Whether you are pregnant or may be pregnant. What are the risks? Generally, this is a safe procedure. However, problems may occur, including:  Infection.  Bleeding.  Allergic reactions to medicines.  Damage to other structures or organs.  Urinary tract infections.  Lymphedema. This is when your legs swell after the removal of lymph nodes from your groin area.  Pain or decreased sexual pleasure when having sex.  Long-term vaginal swelling, tightness, numbness, or pain.  A blood clot that may travel to the lung (pulmonary embolism).  What happens before the procedure?  Follow instructions from your health care  provider about eating or drinking restrictions.  Ask your health care provider about: ? Changing or stopping your regular medicines. This is especially important if you are taking diabetes medicines or blood thinners. ? Taking medicines such as aspirin and ibuprofen. These medicines can thin your blood. Do not take these medicines before your procedure if your health care provider instructs you not to.  Ask your health care provider how your surgical site will be marked or identified.  You may be given antibiotic medicine to help prevent infection.  Plan to have someone take you home after the procedure.  If you will be going home right after the procedure, plan to have someone with you for 24 hours. What happens during the procedure?  To reduce your risk of infection: ? Your health care team will wash or sanitize their hands. ? Your skin will be washed with soap.  An IV tube will be inserted into one of your veins.  You will be given one or more of the following: ? A medicine to help you relax (sedative). ? A medicine to make you fall asleep (general anesthetic). ? A medicine that is injected into your spine to numb the area below and slightly above the injection site (spinal anesthetic).  A tube (catheter) may be inserted through the outer opening of your bladder (urethra) to drain urine during and after surgery.  Depending on the type of vulvectomy you are having, your surgeon will make an incision and remove the affected area. This may include: ? Removing the entire vulva. ? Removing part of the vulva, surrounding  deep tissue, and lymph nodes. ? Removing the vulva, clitoris, surrounding deep tissue, and lymph nodes. Your incisions will be closed  The procedure may vary among health care providers and hospitals. What happens after the procedure?  Your blood pressure, heart rate, breathing rate, and blood oxygen level will be monitored often until the medicines you were given  have worn off.  You will get medicine for pain as needed.  You may get medicine to prevent constipation.  You may be on a liquid diet at first, and then switch to a regular diet.  When you are taking fluids well, your IV will be removed.  If your catheter was left in place after surgery, it will be removed when your health care provider approves.  You will be asked to breathe deeply and to get out of bed and walk as soon as you can.  Do not drive for 24 hours if you received a sedative or as directed by Dr. Gerarda Fraction. This information is not intended to replace advice given to you by your health care provider. Make sure you discuss any questions you have with your health care provider. Document Released: 11/15/2015 Document Revised: 03/26/2016 Document Reviewed: 10/14/2015 Elsevier Interactive Patient Education  [employer].
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
## 2018-08-19 MED FILL — BIKTARVY 50-200-25 MG TABS: 50-200-25 | 30 days supply | Qty: 30 | Fill #6

## 2018-08-25 ENCOUNTER — Other Ambulatory Visit: Payer: Self-pay

## 2018-08-25 ENCOUNTER — Telehealth: Payer: Self-pay | Admitting: Gynecologic Oncology

## 2018-08-25 ENCOUNTER — Encounter (HOSPITAL_BASED_OUTPATIENT_CLINIC_OR_DEPARTMENT_OTHER): Payer: Self-pay | Admitting: *Deleted

## 2018-08-25 NOTE — Progress Notes (Signed)
Spoke with patient via telephone to update her surgery date and arrival time. Her surgery has been rescheduled for Friday 10/25 and her new arrival time is 0630. Patient verbalized understanding of surgery date and time change.

## 2018-08-25 NOTE — Progress Notes (Addendum)
Spoke with patient via telephone to notify surgery was rescheduled for 10/30 and arrival time was now 0530. NPO after MN. No medications AM of surgery. CBC and CMP on chart and in Epic.

## 2018-08-25 NOTE — Telephone Encounter (Signed)
Spoke with the patient at 8:15 am about opening tomorrow on OR schedule to see if she would like to move up her procedure to tomorrow since she requested a sooner date at her visit.  She stated at that time she would like to move up.  Patient called back to the office 15 min later and stated she had a fever last night as high as 100.8 and she had a cough and she has changed her mind and would like to stay on Oct 30. OR case moved back to Oct 30. [facility] notified.  Patient advised to call for any needs.

## 2018-08-30 ENCOUNTER — Encounter (HOSPITAL_BASED_OUTPATIENT_CLINIC_OR_DEPARTMENT_OTHER): Payer: Self-pay | Admitting: Anesthesiology

## 2018-08-30 NOTE — Anesthesia Preprocedure Evaluation (Addendum)
Anesthesia Evaluation  Patient identified by MRN, date of birth, ID band Patient awake    Reviewed: Allergy & Precautions, NPO status , Patient's Chart, lab work & pertinent test results  Airway Mallampati: I       Dental  (+) Upper Dentures   Pulmonary former smoker,    Pulmonary exam normal breath sounds clear to auscultation       Cardiovascular negative cardio ROS Normal cardiovascular exam Rhythm:Regular Rate:Normal     Neuro/Psych PSYCHIATRIC DISORDERS Depression negative neurological ROS     GI/Hepatic negative GI ROS, Neg liver ROS,   Endo/Other  negative endocrine ROSObesity  Renal/GU negative Renal ROS     Musculoskeletal negative musculoskeletal ROS (+)   Abdominal (+) + obese,   Peds  Hematology  (+) HIV,   Anesthesia Other Findings   Reproductive/Obstetrics Vulvar lesion HSV                            Anesthesia Physical  Anesthesia Plan  ASA: II  Anesthesia Plan: General   Post-op Pain Management:    Induction: Intravenous  PONV Risk Score and Plan: 4 or greater and Ondansetron, Dexamethasone and Midazolam  Airway Management Planned: LMA  Additional Equipment:   Intra-op Plan:   Post-operative Plan: Extubation in OR  Informed Consent: I have reviewed the patients History and Physical, chart, labs and discussed the procedure including the risks, benefits and alternatives for the proposed anesthesia with the patient or authorized representative who has indicated his/her understanding and acceptance.     Plan Discussed with: CRNA  Anesthesia Plan Comments:        Anesthesia Quick Evaluation

## 2018-08-31 ENCOUNTER — Ambulatory Visit (HOSPITAL_BASED_OUTPATIENT_CLINIC_OR_DEPARTMENT_OTHER): Payer: 59 | Admitting: Anesthesiology

## 2018-08-31 ENCOUNTER — Other Ambulatory Visit: Payer: Self-pay

## 2018-08-31 ENCOUNTER — Encounter (HOSPITAL_BASED_OUTPATIENT_CLINIC_OR_DEPARTMENT_OTHER): Payer: Self-pay | Admitting: *Deleted

## 2018-08-31 ENCOUNTER — Encounter (HOSPITAL_BASED_OUTPATIENT_CLINIC_OR_DEPARTMENT_OTHER)
Admission: RE | Disposition: A | Discharge status: Home / Self Care | Payer: Self-pay | Source: Ambulatory Visit | Attending: Obstetrics

## 2018-08-31 ENCOUNTER — Ambulatory Visit (HOSPITAL_BASED_OUTPATIENT_CLINIC_OR_DEPARTMENT_OTHER)
Admission: RE | Admit: 2018-08-31 | Discharge: 2018-08-31 | Disposition: A | Discharge status: Home / Self Care | Payer: 59 | Source: Ambulatory Visit | Attending: Obstetrics | Admitting: Obstetrics

## 2018-08-31 DIAGNOSIS — D071 Carcinoma in situ of vulva: Secondary | ICD-10-CM | POA: Diagnosis present

## 2018-08-31 DIAGNOSIS — Z21 Asymptomatic human immunodeficiency virus [HIV] infection status: Secondary | ICD-10-CM | POA: Diagnosis not present

## 2018-08-31 DIAGNOSIS — N9089 Other specified noninflammatory disorders of vulva and perineum: Secondary | ICD-10-CM

## 2018-08-31 DIAGNOSIS — Z87891 Personal history of nicotine dependence: Secondary | ICD-10-CM | POA: Diagnosis not present

## 2018-08-31 DIAGNOSIS — Z85828 Personal history of other malignant neoplasm of skin: Secondary | ICD-10-CM | POA: Diagnosis not present

## 2018-08-31 HISTORY — PX: VULVECTOMY: SHX1086

## 2018-08-31 SURGERY — VULVECTOMY
Anesthesia: General | Site: Perineum

## 2018-08-31 MED ORDER — ACETIC ACID 5 % SOLN
Status: DC | PRN
  Administered 2018-08-31: 1 via TOPICAL

## 2018-08-31 MED ORDER — ONDANSETRON HCL 4 MG/2ML IJ SOLN
INTRAMUSCULAR | Status: DC | PRN
  Administered 2018-08-31: 4 mg via INTRAVENOUS

## 2018-08-31 MED ORDER — PROPOFOL 10 MG/ML IV BOLUS
INTRAVENOUS | Status: DC | PRN
  Administered 2018-08-31: 200 mg via INTRAVENOUS

## 2018-08-31 MED ORDER — ONDANSETRON HCL 4 MG/2ML IJ SOLN
4.0000 mg | Freq: Once | INTRAMUSCULAR | Status: DC | PRN
  Filled 2018-08-31: qty 2

## 2018-08-31 MED ORDER — KETOROLAC TROMETHAMINE 30 MG/ML IJ SOLN
30.0000 mg | Freq: Once | INTRAMUSCULAR | Status: DC | PRN
  Filled 2018-08-31: qty 1

## 2018-08-31 MED ORDER — LIDOCAINE-EPINEPHRINE 1 %-1:100000 IJ SOLN
INTRAMUSCULAR | Status: DC | PRN
  Administered 2018-08-31: 10 mL

## 2018-08-31 MED ORDER — MIDAZOLAM HCL 2 MG/2ML IJ SOLN
INTRAMUSCULAR | Status: AC
  Filled 2018-08-31: qty 2

## 2018-08-31 MED ORDER — CEFAZOLIN SODIUM-DEXTROSE 2-4 GM/100ML-% IV SOLN
2.0000 g | INTRAVENOUS | Status: AC
  Administered 2018-08-31: 2 g via INTRAVENOUS
  Filled 2018-08-31: qty 100

## 2018-08-31 MED ORDER — CEFAZOLIN SODIUM-DEXTROSE 2-4 GM/100ML-% IV SOLN
INTRAVENOUS | Status: AC
  Filled 2018-08-31: qty 100

## 2018-08-31 MED ORDER — LACTATED RINGERS IV SOLN
INTRAVENOUS | Status: DC
  Administered 2018-08-31: 07:00:00 via INTRAVENOUS
  Administered 2018-08-31: 100 mL via INTRAVENOUS
  Administered 2018-08-31: 06:00:00 via INTRAVENOUS
  Filled 2018-08-31: qty 1000

## 2018-08-31 MED ORDER — LIDOCAINE 2% (20 MG/ML) 5 ML SYRINGE
INTRAMUSCULAR | Status: DC | PRN
  Administered 2018-08-31: 100 mg via INTRAVENOUS

## 2018-08-31 MED ORDER — KETOROLAC TROMETHAMINE 30 MG/ML IJ SOLN
INTRAMUSCULAR | Status: DC | PRN
  Administered 2018-08-31: 30 mg via INTRAVENOUS

## 2018-08-31 MED ORDER — FENTANYL CITRATE (PF) 100 MCG/2ML IJ SOLN
INTRAMUSCULAR | Status: AC
  Filled 2018-08-31: qty 2

## 2018-08-31 MED ORDER — OXYCODONE HCL 5 MG/5ML PO SOLN
5.0000 mg | Freq: Once | ORAL | Status: DC | PRN
  Filled 2018-08-31: qty 5

## 2018-08-31 MED ORDER — PHENYLEPHRINE 40 MCG/ML (10ML) SYRINGE FOR IV PUSH (FOR BLOOD PRESSURE SUPPORT)
PREFILLED_SYRINGE | INTRAVENOUS | Status: DC | PRN
  Administered 2018-08-31 (×2): 80 ug via INTRAVENOUS

## 2018-08-31 MED ORDER — FENTANYL CITRATE (PF) 100 MCG/2ML IJ SOLN
INTRAMUSCULAR | Status: DC | PRN
  Administered 2018-08-31: 100 ug via INTRAVENOUS

## 2018-08-31 MED ORDER — PHENYLEPHRINE 40 MCG/ML (10ML) SYRINGE FOR IV PUSH (FOR BLOOD PRESSURE SUPPORT)
PREFILLED_SYRINGE | INTRAVENOUS | Status: AC
  Filled 2018-08-31: qty 10

## 2018-08-31 MED ORDER — MEPERIDINE HCL 25 MG/ML IJ SOLN
6.2500 mg | INTRAMUSCULAR | Status: DC | PRN
  Filled 2018-08-31: qty 1

## 2018-08-31 MED ORDER — ACETAMINOPHEN 325 MG PO TABS
325.0000 mg | ORAL_TABLET | ORAL | Status: DC | PRN
  Filled 2018-08-31: qty 2

## 2018-08-31 MED ORDER — ACETAMINOPHEN 160 MG/5ML PO SOLN
325.0000 mg | ORAL | Status: DC | PRN
  Filled 2018-08-31: qty 20.3

## 2018-08-31 MED ORDER — DEXAMETHASONE SODIUM PHOSPHATE 10 MG/ML IJ SOLN
INTRAMUSCULAR | Status: DC | PRN
  Administered 2018-08-31: 10 mg via INTRAVENOUS

## 2018-08-31 MED ORDER — LIDOCAINE 2% (20 MG/ML) 5 ML SYRINGE
INTRAMUSCULAR | Status: AC
  Filled 2018-08-31: qty 5

## 2018-08-31 MED ORDER — FENTANYL CITRATE (PF) 100 MCG/2ML IJ SOLN
25.0000 ug | INTRAMUSCULAR | Status: DC | PRN
  Filled 2018-08-31: qty 1

## 2018-08-31 MED ORDER — ONDANSETRON HCL 4 MG/2ML IJ SOLN
INTRAMUSCULAR | Status: AC
  Filled 2018-08-31: qty 2

## 2018-08-31 MED ORDER — DEXAMETHASONE SODIUM PHOSPHATE 10 MG/ML IJ SOLN
INTRAMUSCULAR | Status: AC
  Filled 2018-08-31: qty 1

## 2018-08-31 MED ORDER — KETOROLAC TROMETHAMINE 30 MG/ML IJ SOLN
INTRAMUSCULAR | Status: AC
  Filled 2018-08-31: qty 1

## 2018-08-31 MED ORDER — HYDROCODONE-ACETAMINOPHEN 5-325 MG PO TABS: 1.0000 | ORAL_TABLET | Freq: Four times a day (QID) | ORAL | 0 refills | Status: DC | PRN

## 2018-08-31 MED ORDER — PROPOFOL 10 MG/ML IV BOLUS
INTRAVENOUS | Status: AC
  Filled 2018-08-31: qty 40

## 2018-08-31 MED ORDER — OXYCODONE HCL 5 MG PO TABS
5.0000 mg | ORAL_TABLET | Freq: Once | ORAL | Status: DC | PRN
  Filled 2018-08-31: qty 1

## 2018-08-31 MED ORDER — MIDAZOLAM HCL 5 MG/5ML IJ SOLN
INTRAMUSCULAR | Status: DC | PRN
  Administered 2018-08-31: 2 mg via INTRAVENOUS

## 2018-08-31 MED FILL — HYDROCODON-APAP 5-325: 5-325 | 2 days supply | Qty: 5 | Fill #0

## 2018-08-31 SURGICAL SUPPLY — 24 items
BLADE CLIPPER SURG (BLADE) IMPLANT
BLADE SURG 15 STRL LF DISP TIS (BLADE) ×1 IMPLANT
BLADE SURG 15 STRL SS (BLADE) ×3
CANISTER SUCT 3000ML PPV (MISCELLANEOUS) ×3 IMPLANT
CATH ROBINSON RED A/P 16FR (CATHETERS) IMPLANT
COVER WAND RF STERILE (DRAPES) ×3 IMPLANT
GAUZE 4X4 16PLY RFD (DISPOSABLE) ×3 IMPLANT
GLOVE BIO SURGEON STRL SZ 6.5 (GLOVE) ×4 IMPLANT
GLOVE BIO SURGEONS STRL SZ 6.5 (GLOVE) ×2
GLOVE BIOGEL PI IND STRL 7.0 (GLOVE) ×1 IMPLANT
GLOVE BIOGEL PI INDICATOR 7.0 (GLOVE) ×2
KIT TURNOVER CYSTO (KITS) ×3 IMPLANT
NEEDLE HYPO 22GX1.5 SAFETY (NEEDLE) ×3 IMPLANT
NS IRRIG 500ML POUR BTL (IV SOLUTION) ×3 IMPLANT
PACK VAGINAL WOMENS (CUSTOM PROCEDURE TRAY) ×3 IMPLANT
PAD OB MATERNITY 4.3X12.25 (PERSONAL CARE ITEMS) ×3 IMPLANT
SUT VIC AB 0 CT1 36 (SUTURE) ×3 IMPLANT
SUT VIC AB 2-0 SH 27 (SUTURE)
SUT VIC AB 2-0 SH 27XBRD (SUTURE) IMPLANT
SUT VIC AB 3-0 SH 27 (SUTURE)
SUT VIC AB 3-0 SH 27X BRD (SUTURE) IMPLANT
SYR BULB IRRIGATION 50ML (SYRINGE) ×3 IMPLANT
TOWEL OR 17X24 6PK STRL BLUE (TOWEL DISPOSABLE) ×6 IMPLANT
WATER STERILE IRR 500ML POUR (IV SOLUTION) IMPLANT

## 2018-08-31 NOTE — Op Note (Signed)
OPERATIVE REPORT 08/31/18   Preop Diagnosis: Vulvar dysplasia (VIN2-3)  Postoperative Diagnosis: Same  Surgery Performed: Partial simple vulvectomy  Surgeon:  Rutha Bouchard  Assistant: none  Anesthesia: LMA  Estimated blood loss: 1 ml  Complications: None   Pathology: left labia (at 2:00) tagged at 2:00, inferior to this a small area was removed for closure and sent as 6:00 labial margin. Superior to the 2:00 region was hyperpigmented area (inferior) clitoral hood and this was removed and sent as left clitoral hood.  Operative findings: See pathology above. 2:00 lesion was grossly AW with similar margins after applying acetic acid.  Procedure in Detail:  The patient was taken to the operating room and placed in the dorsolithotomy position with SCD hose on. General anesthesia was then induced without difficulty. The perineum was prepped with Betadine. The vagina was prepped with Betadine. In and out catheterization performed ; about 200cc urine. The patient was then draped after the prep was dried.   Timeout was performed the patient, procedure, antibiotic, allergy. 5% acetic acid solution was applied to the perineum. The vulvar tissues were inspected for areas of acetowhite changes or leukoplakia.  See findings. The lesion was identified and the marking pen was used to circumscribe the area with planned appropriate surgical margins. The subcuticular tissues were infiltrated with 1% lidocaine. The 15 blade scalpel was used to make an incision through the skin circumferentially as marked. The skin elipse was grasped and was separated from the underlying deep dermal tissues with the scalpel.  After the specimen had been completely resected, it was oriented and marked at 2:00 with a 0-vicryl suture. The bovie was used to obtain hemostasis at the surgical bed. The clitoris and clitoral hood were examined and no obvious AWL. The left hood did have some hyperpigmentation. This was removed  using the 15-blade scalpel.  To ensure cosmetic closure a small 6:00 tag of epidermis was removed. The cutaneous layer was closed with interrupted 3-0 vicryl stitches in both simple and a vertical mattress fashion to ensure a tension free and hemostatic closure.   All instrument, suture, laparotomy, Ray-Tec, and needle counts were correct x2. The patient tolerated the procedure well and was taken recovery room in stable condition.   Disposition: PACU in stable condition.

## 2018-08-31 NOTE — Discharge Instructions (Signed)
Vulvectomy, Care After  The vulva is the external female genitalia, outside and around the vagina and pubic bone. It consists of:  The skin on, and in front of, the pubic bone.  The clitoris.  The labia majora (large lips) on the outside of the vagina.  The labia minora (small lips) around the opening of the vagina.  The opening and the skin in and around the vagina. A vulvectomy is the removal of the tissue of the vulva, which sometimes includes removal of the lymph nodes and tissue in the groin areas.  These discharge instructions provide you with general information on caring for yourself after you leave the hospital. It is also important that you know the warning signs of complications, so that you can seek treatment. Please read the instructions outlined below and refer to this sheet in the next few weeks. Your caregiver may also give you specific information and medicines. If you have any questions or complications after discharge, please call your caregiver.  ACTIVITY  Rest as much as possible the first two weeks after discharge.  Arrange to have help from family or others with your daily activities when you go home.  Avoid heavy lifting (more than 5 pounds), pushing, or pulling.  If you feel tired, balance your activity with rest periods.  Follow your caregiver's instruction about climbing stairs and driving a car.  Increase activity gradually.  Do not exercise until you have permission from your caregiver. LEG AND FOOT CARE  If your doctor has removed lymph nodes from your groin area, there may be an increase in swelling of your legs and feet. You can help prevent swelling by doing the following:  Elevate your legs while sitting or lying down.  If your caregiver has ordered special stockings, wear them according to instructions.  Avoid standing in one place for long periods of time.  Call the physical therapy department if you have any questions about swelling or treatment for  swelling.  Avoid salt in your diet. It can cause fluid retention and swelling.  Do not cross your legs, especially when sitting. NUTRITION  You may resume your normal diet.  Drink 6 to 8 glasses of fluids a day.  Eat a healthy, balanced diet including portions of food from the meat (protein), milk, fruit, vegetable, and bread groups.  Your caregiver may recommend you take a multivitamin with iron. ELIMINATION  You may notice that your stream of urine is at a different angle, and may tend to spray. Using a plastic funnel may help to decrease urine spray.  If constipation occurs, drink more liquids, and add more fruits, vegetables, and bran to your diet. You may take a mild laxative, such as Milk of Magnesia, Metamucil, or a stool softener such as Colace, with permission from your caregiver. HYGIENE  You may shower and wash your hair.  Check with your caregiver about tub baths.  Do not add any bath oils or chemicals to your bath water, after you have permission to take baths.  Clean yourself well after moving your bowels.  After urinating, wipe from top to bottom.  A sitz bath will help keep your perineal area clean, reduce swelling, and provide comfort. HOME CARE INSTRUCTIONS  Take your temperature twice a day and record it, especially if you feel feverish or have chills.  Follow your caregiver's instructions about medicines, activity, and follow-up appointments after surgery.  Do not drink alcohol while taking pain medicine.  Change your dressing as advised by  your caregiver.  You may take over-the-counter medicine for pain, recommended by your caregiver.  If your pain is not relieved with medicine, call your caregiver.  Do not take aspirin because it can cause bleeding.  Do not douche or use tampons (use a nonperfumed sanitary pad).  Do not have sexual intercourse until your caregiver gives you permission. Hugging, kissing, and playful sexual activity is fine with your caregiver's  permission.  Warm sitz baths, with your caregiver's permission, are helpful to control swelling and discomfort.  Take showers instead of baths, until your caregiver gives you permission to take baths.  You may take a mild medicine for constipation, recommended by your caregiver. Bran foods and drinking a lot of fluids will help with constipation.  Make sure your family understands everything about your operation and recovery. SEEK MEDICAL CARE IF:  You notice swelling and redness around the wound area.  You notice a foul smell coming from the wound or on the surgical dressing.  You notice the wound is separating.  You have painful or bloody urination.  You develop nausea and vomiting.  You develop diarrhea.  You develop a rash.  You have a reaction or allergy from the medicine.  You feel dizzy or light-headed.  You need stronger pain medicine. SEEK IMMEDIATE MEDICAL CARE IF:  You develop a temperature of 102 F (38.9 C) or higher.  You pass out.  You develop leg or chest pain.  You develop abdominal pain.  You develop shortness of breath.  You develop bleeding from the wound area.  You see pus in the wound area. MAKE SURE YOU:  Understand these instructions.  Will watch your condition.  Will get help right away if you are not doing well or get worse. Document Released: 06/02/2004 Document Revised: 03/05/2014 Document Reviewed: 09/20/2009  [facility] Patient Information 2015 [place], [place]. This information is not intended to replace advice given to you by your health care provider. Make sure you discuss any questions you have with your health care provider.   Do not take any nonsteroidal anti inflammatories (Ibuprofen, Advil, Motrin, Aleve) until after 1:45 pm today.   Post Anesthesia Home Care Instructions  Activity: Get plenty of rest for the remainder of the day. A responsible individual must stay with you for 24 hours following the procedure.  For the next 24 hours, DO  NOT: -Drive a car -Paediatric nurse -Drink alcoholic beverages -Take any medication unless instructed by your physician -Make any legal decisions or sign important papers.  Meals: Start with liquid foods such as gelatin or soup. Progress to regular foods as tolerated. Avoid greasy, spicy, heavy foods. If nausea and/or vomiting occur, drink only clear liquids until the nausea and/or vomiting subsides. Call your physician if vomiting continues.  Special Instructions/Symptoms: Your throat may feel dry or sore from the anesthesia or the breathing tube placed in your throat during surgery. If this causes discomfort, gargle with warm salt water. The discomfort should disappear within 24 hours.  If you had a scopolamine patch placed behind your ear for the management of post- operative nausea and/or vomiting:  1. The medication in the patch is effective for 72 hours, after which it should be removed.  Wrap patch in a tissue and discard in the trash. Wash hands thoroughly with soap and water. 2. You may remove the patch earlier than 72 hours if you experience unpleasant side effects which may include dry mouth, dizziness or visual disturbances. 3. Avoid touching the patch. Wash your  hands with soap and water after contact with the patch.

## 2018-08-31 NOTE — Transfer of Care (Signed)
Immediate Anesthesia Transfer of Care Note  Patient: Sue Mendez  Procedure(s) Performed: PARTIAL VULVECTOMY (N/A Perineum)  Patient Location: PACU  Anesthesia Type:General  Level of Consciousness: awake  Airway & Oxygen Therapy: Patient Spontanous Breathing and Patient connected to nasal cannula oxygen  Post-op Assessment: Report given to RN and Post -op Vital signs reviewed and stable  Post vital signs: Reviewed and stable  Last Vitals: 98/57, 87, 20, 91%98.6 Vitals Value Taken Time  BP    Temp    Pulse    Resp    SpO2      Last Pain:  Vitals:   08/31/18 0539  TempSrc:   PainSc: 0-No pain      Patients Stated Pain Goal: 7 (64/35/39 1225)  Complications: No apparent anesthesia complications

## 2018-08-31 NOTE — Anesthesia Postprocedure Evaluation (Signed)
Anesthesia Post Note  Patient: Sue Mendez  Procedure(s) Performed: PARTIAL VULVECTOMY (N/A Perineum)     Patient location during evaluation: PACU Anesthesia Type: General Level of consciousness: awake Pain management: pain level controlled Vital Signs Assessment: post-procedure vital signs reviewed and stable Respiratory status: spontaneous breathing Cardiovascular status: stable Postop Assessment: no apparent nausea or vomiting Anesthetic complications: no    Last Vitals:  Vitals:   08/31/18 0830 08/31/18 0845  BP: 96/60 (!) 96/59  Pulse: 81 79  Resp: 11 17  Temp:    SpO2: 95% 95%    Last Pain:  Vitals:   08/31/18 0845  TempSrc:   PainSc: 0-No pain   Pain Goal: Patients Stated Pain Goal: 7 (08/31/18 0845)               Huston Foley

## 2018-08-31 NOTE — Anesthesia Procedure Notes (Signed)
Procedure Name: LMA Insertion Date/Time: 08/31/2018 7:35 AM Performed by: Bonney Aid, CRNA Pre-anesthesia Checklist: Patient identified, Emergency Drugs available, Suction available and Patient being monitored Patient Re-evaluated:Patient Re-evaluated prior to induction Oxygen Delivery Method: Circle system utilized Preoxygenation: Pre-oxygenation with 100% oxygen Induction Type: IV induction Ventilation: Mask ventilation without difficulty LMA: LMA inserted LMA Size: 4.0 Number of attempts: 1 Airway Equipment and Method: Bite block Placement Confirmation: positive ETCO2 Tube secured with: Tape Dental Injury: Teeth and Oropharynx as per pre-operative assessment

## 2018-08-31 NOTE — Interval H&P Note (Signed)
History and Physical Interval Note:  08/31/2018 7:03 AM  Sue Mendez  has presented today for surgery, with the diagnosis of vulvar dysplasia  The various methods of treatment have been discussed with the patient and family. After consideration of risks, benefits and other options for treatment, the patient has consented to  Procedure(s): PARTIAL VULVECTOMY (N/A) as a surgical intervention .  The patient's history has been reviewed, patient examined, no change in status, stable for surgery.  I have reviewed the patient's chart and labs.  Questions were answered to the patient's satisfaction.     Isabel Caprice

## 2018-09-01 ENCOUNTER — Encounter (HOSPITAL_BASED_OUTPATIENT_CLINIC_OR_DEPARTMENT_OTHER): Payer: Self-pay | Admitting: Obstetrics

## 2018-09-01 ENCOUNTER — Telehealth: Payer: Self-pay

## 2018-09-01 NOTE — Telephone Encounter (Signed)
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  How is she?"  Pt voiced understanding and I explained Dr Gerarda Fraction would be discussing with her surveillance here on out in regards to these results on her upcoming appt of 11/13.  She reports she is doing well, not painful "yet" she said, reminded her of prescriptions she has for pain med and ibuprofen. Pt voiced understanding and no other needs per pt at this time.

## 2018-09-08 ENCOUNTER — Telehealth: Payer: Self-pay | Admitting: Pharmacist

## 2018-09-08 NOTE — Telephone Encounter (Signed)
Called Sue Mendez to see how she was doing on [place] and she claims to be doing great. No missed doses or side effects. She did want to let Dr. Tommy Medal know about her two surgeries she had in October. She will see him on 12/3.

## 2018-09-14 ENCOUNTER — Encounter: Payer: Self-pay | Admitting: Obstetrics

## 2018-09-14 ENCOUNTER — Inpatient Hospital Stay: Payer: 59 | Attending: Obstetrics | Admitting: Obstetrics

## 2018-09-14 VITALS — BP 108/62 | HR 67 | Temp 98.0°F | Resp 18 | Ht 63.0 in | Wt 187.0 lb

## 2018-09-14 DIAGNOSIS — D071 Carcinoma in situ of vulva: Secondary | ICD-10-CM

## 2018-09-14 DIAGNOSIS — Z9889 Other specified postprocedural states: Secondary | ICD-10-CM

## 2018-09-14 DIAGNOSIS — N901 Moderate vulvar dysplasia: Secondary | ICD-10-CM

## 2018-09-14 MED ORDER — FLUCONAZOLE 150 MG PO TABS: 150.0000 mg | ORAL_TABLET | Freq: Every day | ORAL | 0 refills | Status: AC

## 2018-09-14 MED FILL — FLUCONAZOLE 150 MG TABS: 150 | 3 days supply | Qty: 2 | Fill #0

## 2018-09-14 NOTE — Patient Instructions (Signed)
Return in one month for another vulvar check No intercourse until return to see me

## 2018-09-14 NOTE — Progress Notes (Signed)
S: Some vulvar pain and pruritis, worried she has yeast infection. Would like one of the stitches removed. 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 One stitch removed with suture removal kit. One came out with gentle pressure Some white discharge c/w candida  A/P Final wound check ~1 month Diflucan po x 2 tabs After next visit I will dispo back to Dr. Leo Grosser for continued followup  Cc: Vassie Moment, MD (Ref Ob/Gyn) Tommy Medal, Lavell Islam, MD (PCP)

## 2018-09-16 MED FILL — BIKTARVY 50-200-25 MG TABS: 50-200-25 | 30 days supply | Qty: 30 | Fill #7

## 2018-09-19 ENCOUNTER — Other Ambulatory Visit (HOSPITAL_COMMUNITY)
Admission: RE | Admit: 2018-09-19 | Discharge: 2018-09-19 | Disposition: A | Discharge status: Home / Self Care | Payer: 59 | Source: Ambulatory Visit | Attending: Infectious Disease | Admitting: Infectious Disease

## 2018-09-19 ENCOUNTER — Other Ambulatory Visit: Payer: 59

## 2018-09-19 DIAGNOSIS — Z79899 Other long term (current) drug therapy: Secondary | ICD-10-CM | POA: Insufficient documentation

## 2018-09-19 DIAGNOSIS — Z113 Encounter for screening for infections with a predominantly sexual mode of transmission: Secondary | ICD-10-CM

## 2018-09-19 DIAGNOSIS — B20 Human immunodeficiency virus [HIV] disease: Secondary | ICD-10-CM

## 2018-09-20 LAB — URINE CYTOLOGY ANCILLARY ONLY
Chlamydia: NEGATIVE
Neisseria Gonorrhea: NEGATIVE

## 2018-09-20 LAB — T-HELPER CELL (CD4) - (RCID CLINIC ONLY)
CD4 T CELL HELPER: 35 % (ref 33–55)
CD4 T Cell Abs: 640 /uL (ref 400–2700)

## 2018-09-21 LAB — CBC WITH DIFFERENTIAL/PLATELET
BASOS PCT: 1.2 %
Basophils Absolute: 60 cells/uL (ref 0–200)
EOS ABS: 170 {cells}/uL (ref 15–500)
Eosinophils Relative: 3.4 %
HEMATOCRIT: 35.2 % (ref 35.0–45.0)
HEMOGLOBIN: 12 g/dL (ref 11.7–15.5)
LYMPHS ABS: 1735 {cells}/uL (ref 850–3900)
MCH: 31.9 pg (ref 27.0–33.0)
MCHC: 34.1 g/dL (ref 32.0–36.0)
MCV: 93.6 fL (ref 80.0–100.0)
MPV: 9.7 fL (ref 7.5–12.5)
Monocytes Relative: 8.7 %
NEUTROS ABS: 2600 {cells}/uL (ref 1500–7800)
Neutrophils Relative %: 52 %
PLATELETS: 249 10*3/uL (ref 140–400)
RBC: 3.76 10*6/uL — AB (ref 3.80–5.10)
RDW: 11.8 % (ref 11.0–15.0)
TOTAL LYMPHOCYTE: 34.7 %
WBC: 5 10*3/uL (ref 3.8–10.8)
WBCMIX: 435 {cells}/uL (ref 200–950)

## 2018-09-21 LAB — COMPLETE METABOLIC PANEL WITH GFR
AG Ratio: 1.9 (calc) (ref 1.0–2.5)
ALBUMIN MSPROF: 4.1 g/dL (ref 3.6–5.1)
ALKALINE PHOSPHATASE (APISO): 73 U/L (ref 33–130)
ALT: 14 U/L (ref 6–29)
AST: 18 U/L (ref 10–35)
BUN/Creatinine Ratio: 12 (calc) (ref 6–22)
BUN: 13 mg/dL (ref 7–25)
CALCIUM: 9.2 mg/dL (ref 8.6–10.4)
CO2: 28 mmol/L (ref 20–32)
CREATININE: 1.12 mg/dL — AB (ref 0.50–1.05)
Chloride: 108 mmol/L (ref 98–110)
GFR, EST NON AFRICAN AMERICAN: 56 mL/min/{1.73_m2} — AB (ref >=60)
GFR, Est African American: 65 mL/min/{1.73_m2} (ref >=60)
GLOBULIN: 2.2 g/dL (ref 1.9–3.7)
Glucose, Bld: 93 mg/dL (ref 65–99)
Potassium: 4.2 mmol/L (ref 3.5–5.3)
Sodium: 141 mmol/L (ref 135–146)
Total Bilirubin: 0.5 mg/dL (ref 0.2–1.2)
Total Protein: 6.3 g/dL (ref 6.1–8.1)

## 2018-09-21 LAB — LIPID PANEL
Cholesterol: 154 mg/dL (ref <200)
HDL: 51 mg/dL (ref >50)
LDL CHOLESTEROL (CALC): 88 mg/dL
NON-HDL CHOLESTEROL (CALC): 103 mg/dL (ref <130)
Total CHOL/HDL Ratio: 3 (calc) (ref <5.0)
Triglycerides: 60 mg/dL (ref <150)

## 2018-09-21 LAB — HIV-1 RNA QUANT-NO REFLEX-BLD
HIV 1 RNA Quant: <20 copies/mL
HIV-1 RNA Quant, Log: <1.3 Log copies/mL

## 2018-09-21 LAB — RPR: RPR Ser Ql: NONREACTIVE

## 2018-10-04 ENCOUNTER — Encounter: Payer: 59 | Admitting: Infectious Disease

## 2018-10-17 MED FILL — BIKTARVY 50-200-25 MG TABS: 50-200-25 | 30 days supply | Qty: 30 | Fill #8

## 2018-10-18 ENCOUNTER — Telehealth: Payer: Self-pay

## 2018-10-18 NOTE — Telephone Encounter (Signed)
I need to look at what she changed from. integrase have been assoc w weight gain so we could switch her. When is her appt

## 2018-10-18 NOTE — Progress Notes (Deleted)
S:  Returns for wound check. Recall asked last visit that I remove one of the absorbable sutures. 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 Superior to the 2:00 region was hyperpigmented area (inferior) clitoral hood and this was removed and sent as left clitoral hood. 1. 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 - RESECTION MARGINS ARE NEGATIVE FOR DYSPLASIA. 2. Labium, biopsy, left clitoral hood - HYPERKERATOSIS AND MILD REACTIVE CHANGES. - NO DYSPLASIA IDENTIFIED. 3. Labium, biopsy, 6 o'clock margin - HYPERKERATOSIS AND MILD REACTIVE CHANGES. - NO DYSPLASIA IDENTIFIED.  O: There were no vitals filed for this visit. ***  A/P *** I will dispo back to Dr. Leo Grosser for continued followup We discussed risk of recurrence and re-referra; of meed  Cc: Vassie Moment, MD (Ref Ob/Gyn) Tommy Medal, Lavell Islam, MD (PCP)

## 2018-10-18 NOTE — Telephone Encounter (Signed)
Patient information came up on overdue pap list. Patient was able to take my call and states that she has had a pap smear this year and results were faxed to our office.  Patient complains of recent weight gain after starting Biktarvy. States she has gained 10 lbs and would like to know if she can switch medication. Scheduled appointment for patient to follow-up with Dr. Tommy Medal on 1/16. [place]

## 2018-10-18 NOTE — Telephone Encounter (Signed)
Patient is scheduled for 11/17/18 at 9:45. [place]

## 2018-10-21 ENCOUNTER — Inpatient Hospital Stay: Payer: 59 | Attending: Obstetrics | Admitting: Obstetrics

## 2018-11-11 DIAGNOSIS — D2271 Melanocytic nevi of right lower limb, including hip: Secondary | ICD-10-CM | POA: Diagnosis not present

## 2018-11-11 DIAGNOSIS — D485 Neoplasm of uncertain behavior of skin: Secondary | ICD-10-CM | POA: Diagnosis not present

## 2018-11-11 DIAGNOSIS — L570 Actinic keratosis: Secondary | ICD-10-CM | POA: Diagnosis not present

## 2018-11-11 DIAGNOSIS — Z85828 Personal history of other malignant neoplasm of skin: Secondary | ICD-10-CM | POA: Diagnosis not present

## 2018-11-16 MED FILL — BIKTARVY 50-200-25 MG TABS: 50-200-25 | 30 days supply | Qty: 30 | Fill #9

## 2018-11-17 ENCOUNTER — Ambulatory Visit: Payer: 59 | Admitting: Infectious Disease

## 2018-11-29 ENCOUNTER — Encounter: Payer: Self-pay | Admitting: Infectious Disease

## 2018-11-29 ENCOUNTER — Other Ambulatory Visit: Payer: Self-pay | Admitting: Pharmacist

## 2018-11-29 ENCOUNTER — Telehealth: Payer: Self-pay

## 2018-11-29 ENCOUNTER — Ambulatory Visit (INDEPENDENT_AMBULATORY_CARE_PROVIDER_SITE_OTHER): Payer: 59 | Admitting: Infectious Disease

## 2018-11-29 VITALS — BP 119/78 | HR 73 | Temp 97.9°F | Wt 194.0 lb

## 2018-11-29 DIAGNOSIS — R635 Abnormal weight gain: Secondary | ICD-10-CM | POA: Diagnosis not present

## 2018-11-29 DIAGNOSIS — Z23 Encounter for immunization: Secondary | ICD-10-CM

## 2018-11-29 DIAGNOSIS — B20 Human immunodeficiency virus [HIV] disease: Secondary | ICD-10-CM | POA: Diagnosis not present

## 2018-11-29 DIAGNOSIS — D071 Carcinoma in situ of vulva: Secondary | ICD-10-CM

## 2018-11-29 HISTORY — DX: Abnormal weight gain: R63.5

## 2018-11-29 MED ORDER — DORAVIRINE 100 MG PO TABS: 100.0000 mg | ORAL_TABLET | Freq: Every day | ORAL | 11 refills | Status: DC

## 2018-11-29 MED ORDER — EMTRICITABINE-TENOFOVIR AF 200-25 MG PO TABS: 1.0000 | ORAL_TABLET | Freq: Every day | ORAL | 11 refills | Status: DC

## 2018-11-29 MED FILL — PIFELTRO 100 MG TABS: 100 | 30 days supply | Qty: 30 | Fill #0

## 2018-11-29 MED FILL — DESCOVY 200-25 MG TABS: 200-25 | 30 days supply | Qty: 30 | Fill #0
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
## 2018-11-29 NOTE — Addendum Note (Signed)
Addended by: Aundria Rud on: 11/29/2018 01:40 PM   Modules accepted: Orders

## 2018-11-29 NOTE — Telephone Encounter (Signed)
Agree with Corinne's assessment.

## 2018-11-29 NOTE — Telephone Encounter (Signed)
Spoke with Sue Mendez today in clinic to counsel on new prescriptions of Pifeltro and Descovy. She was previously taking Biktarvy and reports having weight gain of at least 12 pounds in the past couple months. 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  Counseled to take one tablet once daily with or without for both Pifeltro and Descovy. Explained the differences with TDF and TAF in the Descovy and the reduced risks of bone and renal toxicities. Discussed possible side effects of medications including headache, nausea, and dizziness but that these are typically seen more in treatment-naive patients and usually reside within a week or two should they occur. She states that she does not have any trouble remembering to take her medications and noted no drug interactions with her other medications. She receives her medications through [facility] and wishes to have them mailed to her house. Confirmed address and sent to pharmacy to be shipped out today for patient to receive tomorrow. Encouraged her to call with any questions or concerns that she may have before her follow-up appointment.

## 2018-12-15 ENCOUNTER — Encounter: Payer: Self-pay | Admitting: Infectious Diseases

## 2018-12-15 ENCOUNTER — Ambulatory Visit (INDEPENDENT_AMBULATORY_CARE_PROVIDER_SITE_OTHER): Payer: 59 | Admitting: Infectious Diseases

## 2018-12-15 ENCOUNTER — Telehealth: Payer: Self-pay | Admitting: *Deleted

## 2018-12-15 VITALS — BP 107/76 | HR 76 | Temp 98.1°F | Ht 63.0 in | Wt 191.0 lb

## 2018-12-15 DIAGNOSIS — J020 Streptococcal pharyngitis: Secondary | ICD-10-CM | POA: Diagnosis not present

## 2018-12-15 MED ORDER — FLUCONAZOLE 150 MG PO TABS: 150.0000 mg | ORAL_TABLET | Freq: Every day | ORAL | 0 refills | Status: DC

## 2018-12-15 MED ORDER — PENICILLIN V POTASSIUM 500 MG PO TABS: 500.0000 mg | ORAL_TABLET | Freq: Two times a day (BID) | ORAL | 0 refills | Status: AC

## 2018-12-15 NOTE — Patient Instructions (Signed)
Will send in penicillin - take twice a day for 10 days   Please call if you continue to have fevers and symptoms after 48 hours of antibiotics   Will send in diflucan for you if needed for yeast infection should you suffer from this

## 2018-12-15 NOTE — Assessment & Plan Note (Signed)
She has acute exudative pharyngitis/tonsillitis, anterior cervical adenopathy without any signs of viral cause. I suspect she has streptococcal infection. Will give Penicillin 500 mg BID x 10d. Call back if not better after 48 -72 hours.  Will give rx for diflucan 150 mg per request as she gets yeast infections easily with antibiotics.
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
## 2018-12-15 NOTE — Telephone Encounter (Signed)
Patient called, asked for advice. She reports being sick for 3+ days, thought she had the flu, but now thinks it's something else.  She had muscle aches, chills for one day, but has had a persistent raw/sore throat and is audibly wheezing (sounds like a kitten mewing). She states the wheezing gets better during the day, is not out of breath. Patient able to come today at 10:30. Landis Gandy, RN

## 2018-12-22 ENCOUNTER — Other Ambulatory Visit: Payer: 59

## 2018-12-22 DIAGNOSIS — B20 Human immunodeficiency virus [HIV] disease: Secondary | ICD-10-CM

## 2018-12-22 LAB — T-HELPER CELL (CD4) - (RCID CLINIC ONLY)
CD4 % Helper T Cell: 34 % (ref 33–55)
CD4 T CELL ABS: 900 /uL (ref 400–2700)

## 2018-12-26 LAB — COMPLETE METABOLIC PANEL WITH GFR
AG Ratio: 1.9 (calc) (ref 1.0–2.5)
ALBUMIN MSPROF: 4.3 g/dL (ref 3.6–5.1)
ALKALINE PHOSPHATASE (APISO): 71 U/L (ref 37–153)
ALT: 9 U/L (ref 6–29)
AST: 14 U/L (ref 10–35)
BUN / CREAT RATIO: 11 (calc) (ref 6–22)
BUN: 12 mg/dL (ref 7–25)
CHLORIDE: 104 mmol/L (ref 98–110)
CO2: 28 mmol/L (ref 20–32)
Calcium: 9.5 mg/dL (ref 8.6–10.4)
Creat: 1.12 mg/dL — ABNORMAL HIGH (ref 0.50–1.05)
GFR, Est African American: 65 mL/min/{1.73_m2} (ref >=60)
GFR, Est Non African American: 56 mL/min/{1.73_m2} — ABNORMAL LOW (ref >=60)
Globulin: 2.3 g/dL (calc) (ref 1.9–3.7)
Glucose, Bld: 107 mg/dL — ABNORMAL HIGH (ref 65–99)
POTASSIUM: 4.1 mmol/L (ref 3.5–5.3)
Sodium: 141 mmol/L (ref 135–146)
Total Bilirubin: 0.5 mg/dL (ref 0.2–1.2)
Total Protein: 6.6 g/dL (ref 6.1–8.1)

## 2018-12-26 LAB — CBC WITH DIFFERENTIAL/PLATELET
ABSOLUTE MONOCYTES: 418 {cells}/uL (ref 200–950)
BASOS PCT: 1.2 %
Basophils Absolute: 70 cells/uL (ref 0–200)
EOS ABS: 87 {cells}/uL (ref 15–500)
Eosinophils Relative: 1.5 %
HCT: 38.5 % (ref 35.0–45.0)
HEMOGLOBIN: 12.9 g/dL (ref 11.7–15.5)
Lymphs Abs: 2361 cells/uL (ref 850–3900)
MCH: 31.5 pg (ref 27.0–33.0)
MCHC: 33.5 g/dL (ref 32.0–36.0)
MCV: 94.1 fL (ref 80.0–100.0)
MONOS PCT: 7.2 %
MPV: 9.4 fL (ref 7.5–12.5)
NEUTROS ABS: 2865 {cells}/uL (ref 1500–7800)
Neutrophils Relative %: 49.4 %
PLATELETS: 320 10*3/uL (ref 140–400)
RBC: 4.09 10*6/uL (ref 3.80–5.10)
RDW: 11.7 % (ref 11.0–15.0)
TOTAL LYMPHOCYTE: 40.7 %
WBC: 5.8 10*3/uL (ref 3.8–10.8)

## 2018-12-26 LAB — HIV-1 RNA QUANT-NO REFLEX-BLD
HIV 1 RNA QUANT: NOT DETECTED {copies}/mL
HIV-1 RNA Quant, Log: <1.3 Log copies/mL

## 2018-12-26 MED FILL — DESCOVY 200-25 MG TABS: 200-25 | 30 days supply | Qty: 30 | Fill #1

## 2018-12-26 MED FILL — PIFELTRO 100 MG TABS: 100 | 30 days supply | Qty: 30 | Fill #1

## 2018-12-29 ENCOUNTER — Other Ambulatory Visit: Payer: 59

## 2019-01-04 ENCOUNTER — Ambulatory Visit (INDEPENDENT_AMBULATORY_CARE_PROVIDER_SITE_OTHER): Payer: 59 | Admitting: Infectious Disease

## 2019-01-04 ENCOUNTER — Encounter: Payer: Self-pay | Admitting: Infectious Disease

## 2019-01-04 VITALS — BP 111/79 | HR 73 | Temp 98.0°F | Wt 187.0 lb

## 2019-01-04 DIAGNOSIS — B20 Human immunodeficiency virus [HIV] disease: Secondary | ICD-10-CM

## 2019-01-04 DIAGNOSIS — Z79899 Other long term (current) drug therapy: Secondary | ICD-10-CM | POA: Diagnosis not present

## 2019-01-04 DIAGNOSIS — R160 Hepatomegaly, not elsewhere classified: Secondary | ICD-10-CM

## 2019-01-04 DIAGNOSIS — Z113 Encounter for screening for infections with a predominantly sexual mode of transmission: Secondary | ICD-10-CM

## 2019-01-04 DIAGNOSIS — D071 Carcinoma in situ of vulva: Secondary | ICD-10-CM

## 2019-01-04 HISTORY — DX: Hepatomegaly, not elsewhere classified: R16.0

## 2019-01-04 MED ORDER — DORAVIRINE 100 MG PO TABS: 100.0000 mg | ORAL_TABLET | Freq: Every day | ORAL | 11 refills | Status: DC

## 2019-01-04 MED ORDER — EMTRICITABINE-TENOFOVIR AF 200-25 MG PO TABS: 1.0000 | ORAL_TABLET | Freq: Every day | ORAL | 11 refills | Status: DC
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
## 2019-01-09 ENCOUNTER — Ambulatory Visit
Admission: RE | Admit: 2019-01-09 | Discharge: 2019-01-09 | Disposition: A | Discharge status: Home / Self Care | Payer: 59 | Source: Ambulatory Visit | Attending: Infectious Disease | Admitting: Infectious Disease

## 2019-01-09 DIAGNOSIS — K7689 Other specified diseases of liver: Secondary | ICD-10-CM | POA: Diagnosis not present

## 2019-01-09 DIAGNOSIS — R160 Hepatomegaly, not elsewhere classified: Secondary | ICD-10-CM

## 2019-01-11 ENCOUNTER — Telehealth: Payer: Self-pay | Admitting: *Deleted

## 2019-01-11 NOTE — Telephone Encounter (Signed)
Patient called to get the results of her recent MRI scan. Advised her will let the provider know she called and give her a call back once he responds.

## 2019-01-15 NOTE — Telephone Encounter (Signed)
MRI shows some disk herniation but nothing that requires urgent intervention. This can be evaluated by a Neurosurgeon as an outpatient as an elective basis.

## 2019-01-19 MED FILL — DESCOVY 200-25 MG TABS: 200-25 | 30 days supply | Qty: 30 | Fill #2

## 2019-01-19 MED FILL — PIFELTRO 100 MG TABS: 100 | 30 days supply | Qty: 30 | Fill #2

## 2019-02-06 ENCOUNTER — Other Ambulatory Visit: Payer: Self-pay | Admitting: Behavioral Health

## 2019-02-06 ENCOUNTER — Telehealth: Payer: Self-pay | Admitting: Behavioral Health

## 2019-02-06 DIAGNOSIS — J020 Streptococcal pharyngitis: Secondary | ICD-10-CM

## 2019-02-06 MED ORDER — AMOXICILLIN 500 MG PO CAPS: 500.0000 mg | ORAL_CAPSULE | Freq: Three times a day (TID) | ORAL | 0 refills | Status: AC

## 2019-02-06 NOTE — Telephone Encounter (Signed)
She can take amoxicillin 500mg  TID x 10 days IN case this is GAS. She should self quarantine in case this might be novel CoVID and she should if at all possible avoid being around ppl she does NOT have to be around

## 2019-02-06 NOTE — Telephone Encounter (Signed)
Sue Mendez, informed her per Dr. Tommy Medal that Amoxicillin 500 mg TID X10 days was called into [facility] Sue Mendez. Also informed her per Dr. Tommy Medal she should self quarantine in case her sore throat is due to novel Covid and if possible avoid being around people she does not need to be around.  Patient verbalized understanding stating she has been working from home and following the proper precaustions. [name] RN

## 2019-02-06 NOTE — Telephone Encounter (Signed)
Patient called stating both of her grand children she has been around have both tested positive with Strep throat recently.  She states for the last week she has has a sore throat on the right side that is not getting better.  She states she does not have any issues tolerating fluids but is having difficulty eating.  She states she is unable to see the back of her throat because it appears to be swollen.  She states she uses the [facility] on [address] [place].  Informed her a message would be sent to Dr. Tommy Medal ans will call her back. [name] RN

## 2019-02-15 MED FILL — PIFELTRO 100 MG TABS: 100 | 30 days supply | Qty: 30 | Fill #3

## 2019-02-15 MED FILL — DESCOVY 200-25 MG TABS: 200-25 | 30 days supply | Qty: 30 | Fill #3

## 2019-03-02 ENCOUNTER — Telehealth: Payer: Self-pay

## 2019-03-02 NOTE — Telephone Encounter (Signed)
Patient called states she has not been able to sleep since starting her Doravirine and Discovy. States she takes medication at night. LPN advised her to try taking medication in AM to see if that helps. Patient would also like a call from MD regarding [place] results. Routing message to MD to make aware.  Eugenia Mcalpine, LPN

## 2019-03-03 NOTE — Telephone Encounter (Signed)
Sue Mendez dont know how I did not see the [place] result until she asked for it  The radiologists cannot access the out of state record for comparison and suggest MRI of the liver  I can definitely order this though as you know it very likely will not get done until June  Re her ARV the type of drug she has been switched to the [facility] and the prior drug she was on Efavirenz that was in her Atripla both cause problems with sleep in some of our patients  She could see if this gets better with taking the drug in the am as one possibility  WE could even go back and change her to Efavirenz and Descovy which would be a modern version of Atripla  It might be a good idea for her to set up an evisit with me re her ARV

## 2019-03-13 ENCOUNTER — Ambulatory Visit (INDEPENDENT_AMBULATORY_CARE_PROVIDER_SITE_OTHER): Payer: 59 | Admitting: Infectious Disease

## 2019-03-13 ENCOUNTER — Other Ambulatory Visit: Payer: Self-pay

## 2019-03-13 ENCOUNTER — Encounter: Payer: Self-pay | Admitting: Infectious Disease

## 2019-03-13 DIAGNOSIS — G4700 Insomnia, unspecified: Secondary | ICD-10-CM | POA: Insufficient documentation

## 2019-03-13 DIAGNOSIS — Z79899 Other long term (current) drug therapy: Secondary | ICD-10-CM

## 2019-03-13 DIAGNOSIS — R160 Hepatomegaly, not elsewhere classified: Secondary | ICD-10-CM | POA: Diagnosis not present

## 2019-03-13 DIAGNOSIS — F19982 Other psychoactive substance use, unspecified with psychoactive substance-induced sleep disorder: Secondary | ICD-10-CM

## 2019-03-13 DIAGNOSIS — K769 Liver disease, unspecified: Secondary | ICD-10-CM | POA: Diagnosis not present

## 2019-03-13 DIAGNOSIS — B20 Human immunodeficiency virus [HIV] disease: Secondary | ICD-10-CM

## 2019-03-13 DIAGNOSIS — Z113 Encounter for screening for infections with a predominantly sexual mode of transmission: Secondary | ICD-10-CM

## 2019-03-13 HISTORY — DX: Insomnia, unspecified: G47.00

## 2019-03-13 MED ORDER — TRAZODONE HCL 50 MG PO TABS: 50.0000 mg | ORAL_TABLET | Freq: Every day | ORAL | 5 refills | Status: DC

## 2019-03-13 MED FILL — PIFELTRO 100 MG TABS: 100 | 30 days supply | Qty: 30 | Fill #4

## 2019-03-13 MED FILL — DESCOVY 200-25 MG TABS: 200-25 | 30 days supply | Qty: 30 | Fill #4

## 2019-03-13 NOTE — Progress Notes (Signed)
Virtual Visit via Telephone Note  I connected with Sue Mendez on 03/13/19 at 10:45 AM EDT by telephone and verified that I am speaking with the correct person using two identifiers.  Location: Patient: Home  Provider: [facility] ID clinic   I discussed the limitations, risks, security and privacy concerns of performing an evaluation and management service by telephone and the availability of in person appointments. I also discussed with the patient that there may be a patient responsible charge related to this service. The patient expressed understanding and agreed to proceed.   History of Present Illness:  Sue Mendez is a 53 year old Caucasian female living with HIV disease who might change to Atripla to [place] who had had weight gain on Biktarvy and whom we ultimately changed to [place] and Descovy.  She had noticed problems with insomnia since making this change.  Her viral load has remained suppressed.  When she called with a concerned about insomnia we had recommended trying to take the medication during the day.  The problem for her than though was that she has a hard time remembering taking it if she takes the medication midday.  I suggested that she take it today when she gets home from work at 11:00 AM and then tomorrow first thing in the morning.  Perhaps if she takes to the daytime she will have less problems with insomnia.  He is also interested in having a medication to try for insomnia for now in the short-term in the form of trazodone.  We also reviewed her ultrasound of her liver which showed a mass that the radiologist could not compared to films done in [place] has had no access to these films.  She would like to proceed with an MRI to further define this area.     Observations/Objective:  HIV disease  Weight gain obesity:  Liver mass  Insomnia  Assessment and Plan:  Kidney disease: For now continue with her current regimen but change dosing schedule to  morning  She is lost some weight since changing off of the integrase strand transfer inhibitor Carbohydrate restriction could be helpful for further weight loss  Liver mass: We will get an MRI to define this  Insomnia: Hopefully changing the schedule of her medication may help with that she could also try trazodone for the short-term Follow Up Instructions:    I discussed the assessment and treatment plan with the patient. The patient was provided an opportunity to ask questions and all were answered. The patient agreed with the plan and demonstrated an understanding of the instructions.   The patient was advised to call back or seek an in-person evaluation if the symptoms worsen or if the condition fails to improve as anticipated.  I provided 22 minutes of non-face-to-face time during this encounter.   Alcide Evener, MD

## 2019-04-01 ENCOUNTER — Other Ambulatory Visit: Payer: 59

## 2019-04-18 MED FILL — PIFELTRO 100 MG TABS: 100 | 30 days supply | Qty: 30 | Fill #5

## 2019-04-18 MED FILL — DESCOVY 200-25 MG TABS: 200-25 | 30 days supply | Qty: 30 | Fill #5

## 2019-05-02 ENCOUNTER — Ambulatory Visit
Admission: RE | Admit: 2019-05-02 | Discharge: 2019-05-02 | Disposition: A | Discharge status: Home / Self Care | Payer: 59 | Source: Ambulatory Visit | Attending: Infectious Disease | Admitting: Infectious Disease

## 2019-05-02 ENCOUNTER — Telehealth: Payer: Self-pay | Admitting: *Deleted

## 2019-05-02 ENCOUNTER — Other Ambulatory Visit: Payer: Self-pay

## 2019-05-02 DIAGNOSIS — K769 Liver disease, unspecified: Secondary | ICD-10-CM

## 2019-05-02 MED ORDER — GADOBENATE DIMEGLUMINE 529 MG/ML IV SOLN
17.0000 mL | Freq: Once | INTRAVENOUS | Status: AC | PRN
  Administered 2019-05-02: 17 mL via INTRAVENOUS

## 2019-05-02 NOTE — Telephone Encounter (Signed)
Patient is worried about her MRI results (feels there was really something to worry about after the rad tech started speaking to her with extra concern after the MRI was complete). Please advise once the results are read. Landis Gandy, RN

## 2019-05-03 NOTE — Telephone Encounter (Signed)
The MRI is read as showing nothing bad at all

## 2019-05-03 NOTE — Telephone Encounter (Signed)
Relayed to patient - thank you.

## 2019-05-16 MED FILL — PIFELTRO 100 MG TABS: 100 | 30 days supply | Qty: 30 | Fill #6

## 2019-05-16 MED FILL — DESCOVY 200-25 MG TABS: 200-25 | 30 days supply | Qty: 30 | Fill #6

## 2019-06-20 MED FILL — PIFELTRO 100 MG TABS: 100 | 30 days supply | Qty: 30 | Fill #7

## 2019-06-20 MED FILL — DESCOVY 200-25 MG TABS: 200-25 | 30 days supply | Qty: 30 | Fill #7

## 2019-07-06 ENCOUNTER — Telehealth: Payer: Self-pay | Admitting: Pharmacist

## 2019-07-06 ENCOUNTER — Other Ambulatory Visit: Payer: Self-pay

## 2019-07-06 ENCOUNTER — Other Ambulatory Visit: Payer: 59

## 2019-07-06 DIAGNOSIS — Z79899 Other long term (current) drug therapy: Secondary | ICD-10-CM

## 2019-07-06 DIAGNOSIS — B20 Human immunodeficiency virus [HIV] disease: Secondary | ICD-10-CM

## 2019-07-06 DIAGNOSIS — Z113 Encounter for screening for infections with a predominantly sexual mode of transmission: Secondary | ICD-10-CM

## 2019-07-06 NOTE — Telephone Encounter (Signed)
Patient came in for labs and asked to speak with me.  She is moving to [place] and is worried about getting her medications set up there.  We discussed a few options and she would rather [employer] mail it to her daughter's house and then she will mail it to her in [place]. I said that was not a problem. She sees Dr. Tommy Medal on 9/21 and will give me her daughter's address at that time.  I will then coordinate with [place].

## 2019-07-07 LAB — T-HELPER CELL (CD4) - (RCID CLINIC ONLY)
CD4 % Helper T Cell: 38 % (ref 33–65)
CD4 T Cell Abs: 896 /uL (ref 400–1790)

## 2019-07-07 NOTE — Telephone Encounter (Signed)
Sounds good to me thanks [name]

## 2019-07-10 LAB — CBC WITH DIFFERENTIAL/PLATELET
Absolute Monocytes: 411 cells/uL (ref 200–950)
Basophils Absolute: 62 cells/uL (ref 0–200)
Basophils Relative: 1.2 %
Eosinophils Absolute: 88 cells/uL (ref 15–500)
Eosinophils Relative: 1.7 %
HCT: 36.7 % (ref 35.0–45.0)
Hemoglobin: 12.2 g/dL (ref 11.7–15.5)
Lymphs Abs: 2309 cells/uL (ref 850–3900)
MCH: 31.4 pg (ref 27.0–33.0)
MCHC: 33.2 g/dL (ref 32.0–36.0)
MCV: 94.6 fL (ref 80.0–100.0)
MPV: 9.9 fL (ref 7.5–12.5)
Monocytes Relative: 7.9 %
Neutro Abs: 2330 cells/uL (ref 1500–7800)
Neutrophils Relative %: 44.8 %
Platelets: 253 10*3/uL (ref 140–400)
RBC: 3.88 10*6/uL (ref 3.80–5.10)
RDW: 11.9 % (ref 11.0–15.0)
Total Lymphocyte: 44.4 %
WBC: 5.2 10*3/uL (ref 3.8–10.8)

## 2019-07-10 LAB — LIPID PANEL
Cholesterol: 145 mg/dL (ref <200)
HDL: 50 mg/dL (ref >=50)
LDL Cholesterol (Calc): 77 mg/dL (calc)
Non-HDL Cholesterol (Calc): 95 mg/dL (calc) (ref <130)
Total CHOL/HDL Ratio: 2.9 (calc) (ref <5.0)
Triglycerides: 93 mg/dL (ref <150)

## 2019-07-10 LAB — COMPLETE METABOLIC PANEL WITH GFR
AG Ratio: 2.3 (calc) (ref 1.0–2.5)
ALT: 13 U/L (ref 6–29)
AST: 18 U/L (ref 10–35)
Albumin: 4.3 g/dL (ref 3.6–5.1)
Alkaline phosphatase (APISO): 60 U/L (ref 37–153)
BUN: 15 mg/dL (ref 7–25)
CO2: 28 mmol/L (ref 20–32)
Calcium: 9.5 mg/dL (ref 8.6–10.4)
Chloride: 107 mmol/L (ref 98–110)
Creat: 0.97 mg/dL (ref 0.50–1.05)
GFR, Est African American: 77 mL/min/{1.73_m2} (ref >=60)
GFR, Est Non African American: 67 mL/min/{1.73_m2} (ref >=60)
Globulin: 1.9 g/dL (calc) (ref 1.9–3.7)
Glucose, Bld: 94 mg/dL (ref 65–99)
Potassium: 4.4 mmol/L (ref 3.5–5.3)
Sodium: 141 mmol/L (ref 135–146)
Total Bilirubin: 0.5 mg/dL (ref 0.2–1.2)
Total Protein: 6.2 g/dL (ref 6.1–8.1)

## 2019-07-10 LAB — RPR: RPR Ser Ql: NONREACTIVE

## 2019-07-10 LAB — HIV-1 RNA QUANT-NO REFLEX-BLD
HIV 1 RNA Quant: <20 copies/mL
HIV-1 RNA Quant, Log: <1.3 Log copies/mL

## 2019-07-13 MED FILL — DESCOVY 200-25 MG TABS: 200-25 | 30 days supply | Qty: 30 | Fill #8

## 2019-07-13 MED FILL — PIFELTRO 100 MG TABS: 100 | 30 days supply | Qty: 30 | Fill #8

## 2019-07-17 ENCOUNTER — Other Ambulatory Visit: Payer: 59

## 2019-07-20 ENCOUNTER — Telehealth: Payer: Self-pay | Admitting: *Deleted

## 2019-07-20 NOTE — Telephone Encounter (Signed)
It sounds as if the positive individual is not there anymore.  I do not know if other individuals there might be asymptomatic carriers and I do not know who is been tested.  She wearing a mask and how many people she can contact with it work?
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
## 2019-07-20 NOTE — Telephone Encounter (Signed)
RN relayed to patient.  She called her employer and found a replacement for her shift.

## 2019-07-21 NOTE — Telephone Encounter (Signed)
Ok very good.

## 2019-07-24 ENCOUNTER — Encounter: Payer: Self-pay | Admitting: Infectious Disease

## 2019-07-24 ENCOUNTER — Ambulatory Visit (INDEPENDENT_AMBULATORY_CARE_PROVIDER_SITE_OTHER): Payer: 59 | Admitting: Pharmacist

## 2019-07-24 ENCOUNTER — Other Ambulatory Visit: Payer: Self-pay

## 2019-07-24 ENCOUNTER — Ambulatory Visit (INDEPENDENT_AMBULATORY_CARE_PROVIDER_SITE_OTHER): Payer: 59 | Admitting: Infectious Disease

## 2019-07-24 VITALS — BP 107/75 | HR 68 | Temp 98.9°F

## 2019-07-24 DIAGNOSIS — R160 Hepatomegaly, not elsewhere classified: Secondary | ICD-10-CM | POA: Diagnosis not present

## 2019-07-24 DIAGNOSIS — B20 Human immunodeficiency virus [HIV] disease: Secondary | ICD-10-CM | POA: Diagnosis not present

## 2019-07-24 DIAGNOSIS — G4700 Insomnia, unspecified: Secondary | ICD-10-CM | POA: Diagnosis not present

## 2019-07-24 DIAGNOSIS — Z23 Encounter for immunization: Secondary | ICD-10-CM

## 2019-07-24 DIAGNOSIS — R635 Abnormal weight gain: Secondary | ICD-10-CM | POA: Diagnosis not present

## 2019-07-24 DIAGNOSIS — D071 Carcinoma in situ of vulva: Secondary | ICD-10-CM

## 2019-07-24 MED ORDER — EMTRICITABINE-TENOFOVIR AF 200-25 MG PO TABS: 1.0000 | ORAL_TABLET | Freq: Every day | ORAL | 11 refills | Status: DC

## 2019-07-24 MED ORDER — DORAVIRINE 100 MG PO TABS: 100.0000 mg | ORAL_TABLET | Freq: Every day | ORAL | 11 refills | Status: DC
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
## 2019-07-24 NOTE — Addendum Note (Signed)
Addended by: Lenore Cordia on: 07/24/2019 12:17 PM   Modules accepted: Orders

## 2019-07-24 NOTE — Progress Notes (Signed)
Patient is moving to [place] but will follow up here yearly and still continue her care with [place].  She would really like to continue getting her medications filled at [facility].  We are unable to ship to [place], but her daughter will pick up from [facility] and she will then mail it to her in [place].  Told her to call me with any issues.

## 2019-07-25 ENCOUNTER — Telehealth: Payer: Self-pay

## 2019-07-25 NOTE — Telephone Encounter (Signed)
Patient called office to inform Dr. Tommy Medal that she noticed her Left arm is swollen and hurts. Patient states she received her flu shot yesterday in that arm, and is not sure what to do regarding this. Advised for patient to take tylenol/ibuprofen for pain, and to use a warm compress to help with the swelling. Will route message to MD to advise on next steps for patient. [place]

## 2019-07-25 NOTE — Telephone Encounter (Signed)
That is all I would do. Hopefully just a bruise or hematoma. Too early to be an infection but lets watch it

## 2019-07-26 NOTE — Telephone Encounter (Signed)
Called patient to follow up on how she is feeling. Patient states that yesterday after talking to [place] she noticed fever and chills. Feels like arm pain is getting worse. Advised patient to continue taking aleve and warm compresses for arm pain. Advised patient to call office next week with an update. If patient feels like this is getting worse next week she can schedule appointment to see MD. Patient understands if she feels like she needs to see someone before next week to go to urgent care.  [place]

## 2019-08-08 MED FILL — DESCOVY 200-25 MG TABS: 200-25 | 30 days supply | Qty: 30 | Fill #9

## 2019-08-08 MED FILL — PIFELTRO 100 MG TABS: 100 | 30 days supply | Qty: 30 | Fill #9

## 2019-09-04 MED FILL — PIFELTRO 100 MG TABS: 100 | 30 days supply | Qty: 30 | Fill #10

## 2019-09-04 MED FILL — DESCOVY 200-25 MG TABS: 200-25 | 30 days supply | Qty: 30 | Fill #10

## 2019-09-13 ENCOUNTER — Telehealth: Payer: Self-pay | Admitting: *Deleted

## 2019-09-13 NOTE — Telephone Encounter (Signed)
Patient called to ask if it is ok to take a supplement Spirulina with her HIV medications? It is for weigh control. She also had to change insurance and is not sure they will cover the Descovy she takes they may require a PA. Advised will ask provider or pharmacy about any interaction between medications and someone will give her a call back.

## 2019-09-18 NOTE — Telephone Encounter (Signed)
I dont see why there would be a problem. This just seems like a bunhc of bacteria. I doubt it will be very helpful to her though

## 2019-09-19 NOTE — Telephone Encounter (Signed)
Per Sue Mendez called patient and had to leave a message for the to call the office when she is available.

## 2019-09-27 MED FILL — DESCOVY 200-25 MG TABS: 200-25 | 30 days supply | Qty: 30 | Fill #11

## 2019-09-27 MED FILL — PIFELTRO 100 MG TABS: 100 | 30 days supply | Qty: 30 | Fill #11

## 2019-10-24 ENCOUNTER — Other Ambulatory Visit: Payer: Self-pay | Admitting: Infectious Disease

## 2019-10-24 DIAGNOSIS — B20 Human immunodeficiency virus [HIV] disease: Secondary | ICD-10-CM

## 2019-10-30 MED FILL — DESCOVY 200-25 MG TABS: 200-25 | 30 days supply | Qty: 30 | Fill #0

## 2019-10-30 MED FILL — PIFELTRO 100 MG TABS: 100 | 30 days supply | Qty: 30 | Fill #0

## 2019-11-28 MED FILL — PIFELTRO 100 MG TABS: 100 | 30 days supply | Qty: 30 | Fill #1

## 2019-11-28 MED FILL — DESCOVY 200-25 MG TABS: 200-25 | 30 days supply | Qty: 30 | Fill #1

## 2019-12-28 MED FILL — DESCOVY 200-25 MG TABS: 200-25 | 30 days supply | Qty: 30 | Fill #2

## 2019-12-28 MED FILL — PIFELTRO 100 MG TABS: 100 | 30 days supply | Qty: 30 | Fill #2

## 2020-01-02 ENCOUNTER — Telehealth: Payer: Self-pay | Admitting: *Deleted

## 2020-01-02 NOTE — Telephone Encounter (Signed)
I let her know - thanks!

## 2020-01-02 NOTE — Telephone Encounter (Signed)
Of course she should get Covid vaccine

## 2020-01-02 NOTE — Telephone Encounter (Signed)
Patient, currently living in [place], called to make sure Dr Tommy Medal is ok with her taking the Covid vaccine. It is not available for her just yet, but she would like his input just the same as she does not have a physician locally. Landis Gandy, RN

## 2020-01-24 MED FILL — DESCOVY 200-25 MG TABS: 200-25 | 30 days supply | Qty: 30 | Fill #3

## 2020-01-24 MED FILL — PIFELTRO 100 MG TABS: 100 | 30 days supply | Qty: 30 | Fill #3

## 2020-02-07 ENCOUNTER — Other Ambulatory Visit: Payer: Self-pay

## 2020-02-07 ENCOUNTER — Other Ambulatory Visit: Payer: 59

## 2020-02-07 DIAGNOSIS — B20 Human immunodeficiency virus [HIV] disease: Secondary | ICD-10-CM

## 2020-02-07 DIAGNOSIS — Z113 Encounter for screening for infections with a predominantly sexual mode of transmission: Secondary | ICD-10-CM

## 2020-02-07 DIAGNOSIS — Z79899 Other long term (current) drug therapy: Secondary | ICD-10-CM

## 2020-02-07 DIAGNOSIS — R160 Hepatomegaly, not elsewhere classified: Secondary | ICD-10-CM

## 2020-02-07 DIAGNOSIS — D071 Carcinoma in situ of vulva: Secondary | ICD-10-CM

## 2020-02-08 LAB — T-HELPER CELL (CD4) - (RCID CLINIC ONLY)
CD4 % Helper T Cell: 36 % (ref 33–65)
CD4 T Cell Abs: 928 /uL (ref 400–1790)

## 2020-02-09 LAB — CBC WITH DIFFERENTIAL/PLATELET
Absolute Monocytes: 558 cells/uL (ref 200–950)
Basophils Absolute: 68 cells/uL (ref 0–200)
Basophils Relative: 1 %
Eosinophils Absolute: 109 cells/uL (ref 15–500)
Eosinophils Relative: 1.6 %
HCT: 38.7 % (ref 35.0–45.0)
Hemoglobin: 12.8 g/dL (ref 11.7–15.5)
Lymphs Abs: 2557 cells/uL (ref 850–3900)
MCH: 32.2 pg (ref 27.0–33.0)
MCHC: 33.1 g/dL (ref 32.0–36.0)
MCV: 97.5 fL (ref 80.0–100.0)
MPV: 9.8 fL (ref 7.5–12.5)
Monocytes Relative: 8.2 %
Neutro Abs: 3509 cells/uL (ref 1500–7800)
Neutrophils Relative %: 51.6 %
Platelets: 261 10*3/uL (ref 140–400)
RBC: 3.97 10*6/uL (ref 3.80–5.10)
RDW: 11.8 % (ref 11.0–15.0)
Total Lymphocyte: 37.6 %
WBC: 6.8 10*3/uL (ref 3.8–10.8)

## 2020-02-09 LAB — COMPLETE METABOLIC PANEL WITH GFR
AG Ratio: 2.1 (calc) (ref 1.0–2.5)
ALT: 11 U/L (ref 6–29)
AST: 12 U/L (ref 10–35)
Albumin: 4.2 g/dL (ref 3.6–5.1)
Alkaline phosphatase (APISO): 73 U/L (ref 37–153)
BUN: 20 mg/dL (ref 7–25)
CO2: 27 mmol/L (ref 20–32)
Calcium: 8.8 mg/dL (ref 8.6–10.4)
Chloride: 111 mmol/L — ABNORMAL HIGH (ref 98–110)
Creat: 0.97 mg/dL (ref 0.50–1.05)
GFR, Est African American: 77 mL/min/{1.73_m2} (ref >=60)
GFR, Est Non African American: 67 mL/min/{1.73_m2} (ref >=60)
Globulin: 2 g/dL (calc) (ref 1.9–3.7)
Glucose, Bld: 100 mg/dL — ABNORMAL HIGH (ref 65–99)
Potassium: 4 mmol/L (ref 3.5–5.3)
Sodium: 142 mmol/L (ref 135–146)
Total Bilirubin: 0.4 mg/dL (ref 0.2–1.2)
Total Protein: 6.2 g/dL (ref 6.1–8.1)

## 2020-02-09 LAB — HIV-1 RNA QUANT-NO REFLEX-BLD
HIV 1 RNA Quant: <20 copies/mL
HIV-1 RNA Quant, Log: <1.3 Log copies/mL

## 2020-02-09 LAB — RPR: RPR Ser Ql: NONREACTIVE

## 2020-02-09 LAB — LIPID PANEL
Cholesterol: 150 mg/dL (ref <200)
HDL: 49 mg/dL — ABNORMAL LOW (ref >=50)
LDL Cholesterol (Calc): 83 mg/dL (calc)
Non-HDL Cholesterol (Calc): 101 mg/dL (calc) (ref <130)
Total CHOL/HDL Ratio: 3.1 (calc) (ref <5.0)
Triglycerides: 88 mg/dL (ref <150)

## 2020-02-22 MED FILL — DESCOVY 200-25 MG TABS: 200-25 | 30 days supply | Qty: 30 | Fill #4

## 2020-02-22 MED FILL — PIFELTRO 100 MG TABS: 100 | 30 days supply | Qty: 30 | Fill #4

## 2020-03-25 MED FILL — DESCOVY 200-25 MG TABS: 200-25 | 30 days supply | Qty: 30 | Fill #5

## 2020-03-25 MED FILL — PIFELTRO 100 MG TABS: 100 | 30 days supply | Qty: 30 | Fill #5

## 2020-04-17 MED FILL — DESCOVY 200-25 MG TABS: 200-25 | 30 days supply | Qty: 30 | Fill #6

## 2020-04-17 MED FILL — PIFELTRO 100 MG TABS: 100 | 30 days supply | Qty: 30 | Fill #6

## 2020-04-30 MED FILL — PIFELTRO 100 MG TABS: 100 | 30 days supply | Qty: 30 | Fill #6

## 2020-04-30 MED FILL — DESCOVY 200-25 MG TABS: 200-25 | 30 days supply | Qty: 30 | Fill #6

## 2020-05-28 MED FILL — DESCOVY 200-25 MG TABS: 200-25 | 30 days supply | Qty: 30 | Fill #7

## 2020-05-28 MED FILL — PIFELTRO 100 MG TABS: 100 | 30 days supply | Qty: 30 | Fill #7

## 2020-06-21 ENCOUNTER — Other Ambulatory Visit: Payer: Self-pay

## 2020-06-21 DIAGNOSIS — Z113 Encounter for screening for infections with a predominantly sexual mode of transmission: Secondary | ICD-10-CM

## 2020-06-21 DIAGNOSIS — B20 Human immunodeficiency virus [HIV] disease: Secondary | ICD-10-CM

## 2020-06-24 ENCOUNTER — Other Ambulatory Visit: Payer: 59

## 2020-06-24 ENCOUNTER — Other Ambulatory Visit: Payer: Self-pay

## 2020-06-24 DIAGNOSIS — B20 Human immunodeficiency virus [HIV] disease: Secondary | ICD-10-CM

## 2020-06-25 LAB — T-HELPER CELL (CD4) - (RCID CLINIC ONLY)
CD4 % Helper T Cell: 39 % (ref 33–65)
CD4 T Cell Abs: 912 /uL (ref 400–1790)

## 2020-06-25 MED FILL — DESCOVY 200-25 MG TABS: 200-25 | 30 days supply | Qty: 30 | Fill #8

## 2020-06-25 MED FILL — PIFELTRO 100 MG TABS: 100 | 30 days supply | Qty: 30 | Fill #8

## 2020-06-26 LAB — COMPLETE METABOLIC PANEL WITH GFR
AG Ratio: 2 (calc) (ref 1.0–2.5)
ALT: 11 U/L (ref 6–29)
AST: 17 U/L (ref 10–35)
Albumin: 4.3 g/dL (ref 3.6–5.1)
Alkaline phosphatase (APISO): 68 U/L (ref 37–153)
BUN: 14 mg/dL (ref 7–25)
CO2: 27 mmol/L (ref 20–32)
Calcium: 9.5 mg/dL (ref 8.6–10.4)
Chloride: 108 mmol/L (ref 98–110)
Creat: 0.78 mg/dL (ref 0.50–1.05)
GFR, Est African American: 100 mL/min/{1.73_m2} (ref >=60)
GFR, Est Non African American: 86 mL/min/{1.73_m2} (ref >=60)
Globulin: 2.2 g/dL (calc) (ref 1.9–3.7)
Glucose, Bld: 67 mg/dL (ref 65–99)
Potassium: 4 mmol/L (ref 3.5–5.3)
Sodium: 141 mmol/L (ref 135–146)
Total Bilirubin: 0.4 mg/dL (ref 0.2–1.2)
Total Protein: 6.5 g/dL (ref 6.1–8.1)

## 2020-06-26 LAB — CBC WITH DIFFERENTIAL/PLATELET
Absolute Monocytes: 434 cells/uL (ref 200–950)
Basophils Absolute: 62 cells/uL (ref 0–200)
Basophils Relative: 1 %
Eosinophils Absolute: 118 cells/uL (ref 15–500)
Eosinophils Relative: 1.9 %
HCT: 39.5 % (ref 35.0–45.0)
Hemoglobin: 13.1 g/dL (ref 11.7–15.5)
Lymphs Abs: 2592 cells/uL (ref 850–3900)
MCH: 32.3 pg (ref 27.0–33.0)
MCHC: 33.2 g/dL (ref 32.0–36.0)
MCV: 97.3 fL (ref 80.0–100.0)
MPV: 9.7 fL (ref 7.5–12.5)
Monocytes Relative: 7 %
Neutro Abs: 2995 cells/uL (ref 1500–7800)
Neutrophils Relative %: 48.3 %
Platelets: 249 10*3/uL (ref 140–400)
RBC: 4.06 10*6/uL (ref 3.80–5.10)
RDW: 11.5 % (ref 11.0–15.0)
Total Lymphocyte: 41.8 %
WBC: 6.2 10*3/uL (ref 3.8–10.8)

## 2020-06-26 LAB — HIV-1 RNA QUANT-NO REFLEX-BLD
HIV 1 RNA Quant: <20 Copies/mL — ABNORMAL HIGH
HIV-1 RNA Quant, Log: <1.3 Log cps/mL — ABNORMAL HIGH

## 2020-07-22 ENCOUNTER — Telehealth (INDEPENDENT_AMBULATORY_CARE_PROVIDER_SITE_OTHER): Payer: 59 | Admitting: Infectious Disease

## 2020-07-22 ENCOUNTER — Other Ambulatory Visit: Payer: Self-pay

## 2020-07-22 ENCOUNTER — Other Ambulatory Visit (HOSPITAL_COMMUNITY): Payer: Self-pay | Admitting: Infectious Disease

## 2020-07-22 DIAGNOSIS — R635 Abnormal weight gain: Secondary | ICD-10-CM | POA: Diagnosis not present

## 2020-07-22 DIAGNOSIS — B20 Human immunodeficiency virus [HIV] disease: Secondary | ICD-10-CM

## 2020-07-22 DIAGNOSIS — N9089 Other specified noninflammatory disorders of vulva and perineum: Secondary | ICD-10-CM | POA: Diagnosis not present

## 2020-07-22 MED ORDER — DORAVIRINE 100 MG PO TABS: ORAL_TABLET | ORAL | 11 refills | Status: DC

## 2020-07-22 MED ORDER — DESCOVY 200-25 MG PO TABS: 1.0000 | ORAL_TABLET | Freq: Every day | ORAL | 11 refills | Status: DC

## 2020-07-22 MED ORDER — VALACYCLOVIR HCL 1 G PO TABS
1.0000 g | ORAL_TABLET | Freq: Two times a day (BID) | ORAL | 3 refills | Status: DC | PRN
Start: 2020-07-22 — End: 2021-04-17

## 2020-07-22 MED FILL — valACYclovir HCL 1 GM TABS: 1 | 10 days supply | Qty: 20 | Fill #0
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
## 2020-07-26 MED FILL — DESCOVY 200-25 MG TABS: 200-25 | 30 days supply | Qty: 30 | Fill #9

## 2020-07-26 MED FILL — PIFELTRO 100 MG TABS: 100 | 30 days supply | Qty: 30 | Fill #9

## 2020-08-22 MED FILL — DESCOVY 200-25 MG TABS: 200-25 | 30 days supply | Qty: 30 | Fill #0

## 2020-08-22 MED FILL — PIFELTRO 100 MG TABS: 100 | 30 days supply | Qty: 30 | Fill #0

## 2020-09-25 MED FILL — DESCOVY 200-25 MG TABS: 200-25 | 30 days supply | Qty: 30 | Fill #1

## 2020-09-25 MED FILL — PIFELTRO 100 MG TABS: 100 | 30 days supply | Qty: 30 | Fill #1

## 2020-10-21 MED FILL — PIFELTRO 100 MG TABS: 100 | 30 days supply | Qty: 30 | Fill #2

## 2020-10-21 MED FILL — DESCOVY 200-25 MG TABS: 200-25 | 30 days supply | Qty: 30 | Fill #2

## 2020-11-22 MED FILL — DESCOVY 200-25 MG TABS: 200-25 | 30 days supply | Qty: 30 | Fill #3

## 2020-11-22 MED FILL — PIFELTRO 100 MG TABS: 100 | 30 days supply | Qty: 30 | Fill #3

## 2020-12-10 ENCOUNTER — Encounter: Payer: Self-pay | Admitting: Infectious Disease

## 2020-12-10 ENCOUNTER — Telehealth (INDEPENDENT_AMBULATORY_CARE_PROVIDER_SITE_OTHER): Payer: 59 | Admitting: Infectious Disease

## 2020-12-10 ENCOUNTER — Other Ambulatory Visit: Payer: Self-pay

## 2020-12-10 DIAGNOSIS — N9089 Other specified noninflammatory disorders of vulva and perineum: Secondary | ICD-10-CM

## 2020-12-10 DIAGNOSIS — R635 Abnormal weight gain: Secondary | ICD-10-CM

## 2020-12-10 DIAGNOSIS — B20 Human immunodeficiency virus [HIV] disease: Secondary | ICD-10-CM | POA: Diagnosis not present

## 2020-12-10 DIAGNOSIS — U071 COVID-19: Secondary | ICD-10-CM | POA: Diagnosis not present

## 2020-12-10 HISTORY — DX: COVID-19: U07.1
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
## 2020-12-23 MED FILL — PIFELTRO 100 MG TABS: 100 | 30 days supply | Qty: 30 | Fill #4

## 2020-12-23 MED FILL — DESCOVY 200-25 MG TABS: 200-25 | 30 days supply | Qty: 30 | Fill #4

## 2021-01-20 MED FILL — PIFELTRO 100 MG TABS: 100 | 30 days supply | Qty: 30 | Fill #5

## 2021-01-20 MED FILL — DESCOVY 200-25 MG TABS: 200-25 | 30 days supply | Qty: 30 | Fill #5

## 2021-01-30 ENCOUNTER — Other Ambulatory Visit (HOSPITAL_COMMUNITY): Payer: Self-pay

## 2021-02-12 ENCOUNTER — Other Ambulatory Visit (HOSPITAL_COMMUNITY): Payer: Self-pay

## 2021-02-12 MED FILL — Emtricitabine-Tenofovir Alafenamide Fumarate Tab 200-25 MG: ORAL | 30 days supply | Qty: 30 | Fill #0 | Status: AC

## 2021-02-12 MED FILL — Doravirine Tab 100 MG: ORAL | 30 days supply | Qty: 30 | Fill #0 | Status: AC

## 2021-02-19 ENCOUNTER — Other Ambulatory Visit (HOSPITAL_COMMUNITY): Payer: Self-pay

## 2021-03-13 ENCOUNTER — Other Ambulatory Visit (HOSPITAL_COMMUNITY): Payer: Self-pay

## 2021-03-14 ENCOUNTER — Other Ambulatory Visit (HOSPITAL_COMMUNITY): Payer: Self-pay

## 2021-03-14 MED FILL — Emtricitabine-Tenofovir Alafenamide Fumarate Tab 200-25 MG: ORAL | 30 days supply | Qty: 30 | Fill #1 | Status: AC

## 2021-03-14 MED FILL — Doravirine Tab 100 MG: ORAL | 30 days supply | Qty: 30 | Fill #1 | Status: AC

## 2021-03-20 ENCOUNTER — Other Ambulatory Visit (HOSPITAL_COMMUNITY): Payer: Self-pay

## 2021-03-26 ENCOUNTER — Other Ambulatory Visit: Payer: Self-pay

## 2021-03-26 DIAGNOSIS — B20 Human immunodeficiency virus [HIV] disease: Secondary | ICD-10-CM

## 2021-03-26 DIAGNOSIS — Z79899 Other long term (current) drug therapy: Secondary | ICD-10-CM

## 2021-04-03 ENCOUNTER — Other Ambulatory Visit: Payer: 59

## 2021-04-03 ENCOUNTER — Other Ambulatory Visit: Payer: Self-pay

## 2021-04-03 DIAGNOSIS — B20 Human immunodeficiency virus [HIV] disease: Secondary | ICD-10-CM

## 2021-04-03 DIAGNOSIS — Z79899 Other long term (current) drug therapy: Secondary | ICD-10-CM

## 2021-04-04 LAB — T-HELPER CELL (CD4) - (RCID CLINIC ONLY)
CD4 % Helper T Cell: 35 % (ref 33–65)
CD4 T Cell Abs: 759 /uL (ref 400–1790)

## 2021-04-06 LAB — COMPREHENSIVE METABOLIC PANEL
AG Ratio: 1.9 (calc) (ref 1.0–2.5)
ALT: 28 U/L (ref 6–29)
AST: 30 U/L (ref 10–35)
Albumin: 4.6 g/dL (ref 3.6–5.1)
Alkaline phosphatase (APISO): 75 U/L (ref 37–153)
BUN: 16 mg/dL (ref 7–25)
CO2: 25 mmol/L (ref 20–32)
Calcium: 9.2 mg/dL (ref 8.6–10.4)
Chloride: 107 mmol/L (ref 98–110)
Creat: 0.84 mg/dL (ref 0.50–1.05)
Globulin: 2.4 g/dL (calc) (ref 1.9–3.7)
Glucose, Bld: 78 mg/dL (ref 65–99)
Potassium: 4.2 mmol/L (ref 3.5–5.3)
Sodium: 141 mmol/L (ref 135–146)
Total Bilirubin: 0.3 mg/dL (ref 0.2–1.2)
Total Protein: 7 g/dL (ref 6.1–8.1)

## 2021-04-06 LAB — CBC WITH DIFFERENTIAL/PLATELET
Absolute Monocytes: 587 cells/uL (ref 200–950)
Basophils Absolute: 68 cells/uL (ref 0–200)
Basophils Relative: 0.8 %
Eosinophils Absolute: 77 cells/uL (ref 15–500)
Eosinophils Relative: 0.9 %
HCT: 40 % (ref 35.0–45.0)
Hemoglobin: 13.2 g/dL (ref 11.7–15.5)
Lymphs Abs: 2278 cells/uL (ref 850–3900)
MCH: 31.4 pg (ref 27.0–33.0)
MCHC: 33 g/dL (ref 32.0–36.0)
MCV: 95.2 fL (ref 80.0–100.0)
MPV: 9.4 fL (ref 7.5–12.5)
Monocytes Relative: 6.9 %
Neutro Abs: 5491 cells/uL (ref 1500–7800)
Neutrophils Relative %: 64.6 %
Platelets: 275 10*3/uL (ref 140–400)
RBC: 4.2 10*6/uL (ref 3.80–5.10)
RDW: 11.6 % (ref 11.0–15.0)
Total Lymphocyte: 26.8 %
WBC: 8.5 10*3/uL (ref 3.8–10.8)

## 2021-04-06 LAB — HIV-1 RNA QUANT-NO REFLEX-BLD
HIV 1 RNA Quant: NOT DETECTED Copies/mL
HIV-1 RNA Quant, Log: NOT DETECTED Log cps/mL

## 2021-04-06 LAB — LIPID PANEL
Cholesterol: 173 mg/dL (ref <200)
HDL: 71 mg/dL (ref >=50)
LDL Cholesterol (Calc): 85 mg/dL (calc)
Non-HDL Cholesterol (Calc): 102 mg/dL (calc) (ref <130)
Total CHOL/HDL Ratio: 2.4 (calc) (ref <5.0)
Triglycerides: 79 mg/dL (ref <150)

## 2021-04-14 ENCOUNTER — Other Ambulatory Visit (HOSPITAL_COMMUNITY): Payer: Self-pay

## 2021-04-14 MED FILL — Doravirine Tab 100 MG: ORAL | 30 days supply | Qty: 30 | Fill #2 | Status: AC

## 2021-04-14 MED FILL — Emtricitabine-Tenofovir Alafenamide Fumarate Tab 200-25 MG: ORAL | 30 days supply | Qty: 30 | Fill #2 | Status: AC

## 2021-04-17 ENCOUNTER — Other Ambulatory Visit: Payer: Self-pay

## 2021-04-17 ENCOUNTER — Telehealth (INDEPENDENT_AMBULATORY_CARE_PROVIDER_SITE_OTHER): Payer: 59 | Admitting: Infectious Disease

## 2021-04-17 ENCOUNTER — Other Ambulatory Visit (HOSPITAL_COMMUNITY): Payer: Self-pay

## 2021-04-17 ENCOUNTER — Encounter: Payer: Self-pay | Admitting: Infectious Disease

## 2021-04-17 DIAGNOSIS — U071 COVID-19: Secondary | ICD-10-CM

## 2021-04-17 DIAGNOSIS — B20 Human immunodeficiency virus [HIV] disease: Secondary | ICD-10-CM | POA: Diagnosis not present

## 2021-04-17 DIAGNOSIS — B009 Herpesviral infection, unspecified: Secondary | ICD-10-CM | POA: Diagnosis not present

## 2021-04-17 HISTORY — DX: Herpesviral infection, unspecified: B00.9

## 2021-04-17 MED ORDER — DORAVIRINE 100 MG PO TABS
ORAL_TABLET | ORAL | 11 refills | Status: DC
  Filled 2021-04-17 – 2021-06-20 (×2): qty 30, 30d supply, fill #0
  Filled 2021-07-16: qty 30, 30d supply, fill #1
  Filled 2021-08-18: qty 30, 30d supply, fill #2
  Filled 2021-09-16: qty 30, 30d supply, fill #3
  Filled 2021-10-22: qty 30, 30d supply, fill #4
  Filled 2021-11-17: qty 30, 30d supply, fill #5

## 2021-04-17 MED ORDER — DESCOVY 200-25 MG PO TABS
1.0000 | ORAL_TABLET | Freq: Every day | ORAL | 11 refills | Status: DC
  Filled 2021-04-17 – 2021-06-20 (×2): qty 30, 30d supply, fill #0
  Filled 2021-07-16: qty 30, 30d supply, fill #1
  Filled 2021-08-18: qty 30, 30d supply, fill #2
  Filled 2021-09-16: qty 30, 30d supply, fill #3
  Filled 2021-10-22: qty 30, 30d supply, fill #4
  Filled 2021-11-17: qty 30, 30d supply, fill #5

## 2021-04-17 MED ORDER — VALACYCLOVIR HCL 1 G PO TABS
1.0000 g | ORAL_TABLET | Freq: Two times a day (BID) | ORAL | 3 refills | Status: DC | PRN
  Filled 2021-04-17: qty 20, 10d supply, fill #0
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
## 2021-04-18 ENCOUNTER — Other Ambulatory Visit (HOSPITAL_COMMUNITY): Payer: Self-pay

## 2021-04-21 ENCOUNTER — Other Ambulatory Visit (HOSPITAL_COMMUNITY): Payer: Self-pay

## 2021-05-14 ENCOUNTER — Other Ambulatory Visit (HOSPITAL_COMMUNITY): Payer: Self-pay

## 2021-05-14 MED FILL — Emtricitabine-Tenofovir Alafenamide Fumarate Tab 200-25 MG: ORAL | 30 days supply | Qty: 30 | Fill #3 | Status: AC

## 2021-05-14 MED FILL — Doravirine Tab 100 MG: ORAL | 30 days supply | Qty: 30 | Fill #3 | Status: AC

## 2021-05-21 ENCOUNTER — Other Ambulatory Visit (HOSPITAL_COMMUNITY): Payer: Self-pay

## 2021-06-09 ENCOUNTER — Other Ambulatory Visit (HOSPITAL_COMMUNITY): Payer: Self-pay

## 2021-06-20 ENCOUNTER — Other Ambulatory Visit (HOSPITAL_COMMUNITY): Payer: Self-pay

## 2021-07-08 ENCOUNTER — Ambulatory Visit: Payer: 59 | Admitting: Infectious Disease

## 2021-07-16 ENCOUNTER — Other Ambulatory Visit (HOSPITAL_COMMUNITY): Payer: Self-pay

## 2021-07-22 ENCOUNTER — Other Ambulatory Visit (HOSPITAL_COMMUNITY): Payer: Self-pay

## 2021-08-18 ENCOUNTER — Other Ambulatory Visit (HOSPITAL_COMMUNITY): Payer: Self-pay

## 2021-08-21 ENCOUNTER — Other Ambulatory Visit (HOSPITAL_COMMUNITY): Payer: Self-pay

## 2021-09-05 ENCOUNTER — Other Ambulatory Visit (HOSPITAL_COMMUNITY): Payer: Self-pay

## 2021-09-16 ENCOUNTER — Other Ambulatory Visit (HOSPITAL_COMMUNITY): Payer: Self-pay

## 2021-09-19 ENCOUNTER — Other Ambulatory Visit (HOSPITAL_COMMUNITY): Payer: Self-pay

## 2021-10-20 ENCOUNTER — Other Ambulatory Visit (HOSPITAL_COMMUNITY): Payer: Self-pay

## 2021-10-21 ENCOUNTER — Other Ambulatory Visit (HOSPITAL_COMMUNITY): Payer: Self-pay

## 2021-10-22 ENCOUNTER — Other Ambulatory Visit (HOSPITAL_COMMUNITY): Payer: Self-pay

## 2021-10-23 ENCOUNTER — Other Ambulatory Visit (HOSPITAL_COMMUNITY): Payer: Self-pay

## 2021-11-17 ENCOUNTER — Other Ambulatory Visit (HOSPITAL_COMMUNITY): Payer: Self-pay

## 2021-11-24 ENCOUNTER — Other Ambulatory Visit (HOSPITAL_COMMUNITY): Payer: Self-pay

## 2021-12-02 ENCOUNTER — Other Ambulatory Visit: Payer: Self-pay

## 2021-12-02 DIAGNOSIS — B20 Human immunodeficiency virus [HIV] disease: Secondary | ICD-10-CM

## 2021-12-02 DIAGNOSIS — Z113 Encounter for screening for infections with a predominantly sexual mode of transmission: Secondary | ICD-10-CM

## 2021-12-04 ENCOUNTER — Other Ambulatory Visit: Payer: Self-pay | Admitting: Infectious Disease

## 2021-12-04 ENCOUNTER — Other Ambulatory Visit: Payer: Self-pay

## 2021-12-04 ENCOUNTER — Other Ambulatory Visit: Payer: 59

## 2021-12-04 DIAGNOSIS — Z113 Encounter for screening for infections with a predominantly sexual mode of transmission: Secondary | ICD-10-CM

## 2021-12-04 DIAGNOSIS — B20 Human immunodeficiency virus [HIV] disease: Secondary | ICD-10-CM

## 2021-12-08 LAB — T-HELPER CELLS (CD4) COUNT (NOT AT ARMC)
Absolute CD4: 793 cells/uL (ref 490–1740)
CD4 T Helper %: 42 % (ref 30–61)
Total lymphocyte count: 1901 cells/uL (ref 850–3900)

## 2021-12-08 LAB — COMPLETE METABOLIC PANEL WITH GFR
AG Ratio: 1.7 (calc) (ref 1.0–2.5)
ALT: 12 U/L (ref 6–29)
AST: 18 U/L (ref 10–35)
Albumin: 4.3 g/dL (ref 3.6–5.1)
Alkaline phosphatase (APISO): 64 U/L (ref 37–153)
BUN/Creatinine Ratio: 13 (calc) (ref 6–22)
BUN: 15 mg/dL (ref 7–25)
CO2: 29 mmol/L (ref 20–32)
Calcium: 9.5 mg/dL (ref 8.6–10.4)
Chloride: 107 mmol/L (ref 98–110)
Creat: 1.12 mg/dL — ABNORMAL HIGH (ref 0.50–1.03)
Globulin: 2.5 g/dL (calc) (ref 1.9–3.7)
Glucose, Bld: 89 mg/dL (ref 65–99)
Potassium: 4.5 mmol/L (ref 3.5–5.3)
Sodium: 142 mmol/L (ref 135–146)
Total Bilirubin: 0.4 mg/dL (ref 0.2–1.2)
Total Protein: 6.8 g/dL (ref 6.1–8.1)
eGFR: 58 mL/min/{1.73_m2} — ABNORMAL LOW (ref >=60)

## 2021-12-08 LAB — CBC WITH DIFFERENTIAL/PLATELET
Absolute Monocytes: 340 cells/uL (ref 200–950)
Basophils Absolute: 52 cells/uL (ref 0–200)
Basophils Relative: 1.2 %
Eosinophils Absolute: 82 cells/uL (ref 15–500)
Eosinophils Relative: 1.9 %
HCT: 38.5 % (ref 35.0–45.0)
Hemoglobin: 12.7 g/dL (ref 11.7–15.5)
Lymphs Abs: 1681 cells/uL (ref 850–3900)
MCH: 32 pg (ref 27.0–33.0)
MCHC: 33 g/dL (ref 32.0–36.0)
MCV: 97 fL (ref 80.0–100.0)
MPV: 9.6 fL (ref 7.5–12.5)
Monocytes Relative: 7.9 %
Neutro Abs: 2146 cells/uL (ref 1500–7800)
Neutrophils Relative %: 49.9 %
Platelets: 230 10*3/uL (ref 140–400)
RBC: 3.97 10*6/uL (ref 3.80–5.10)
RDW: 11.5 % (ref 11.0–15.0)
Total Lymphocyte: 39.1 %
WBC: 4.3 10*3/uL (ref 3.8–10.8)

## 2021-12-08 LAB — RPR: RPR Ser Ql: NONREACTIVE

## 2021-12-08 LAB — HIV-1 RNA QUANT-NO REFLEX-BLD
HIV 1 RNA Quant: NOT DETECTED Copies/mL
HIV-1 RNA Quant, Log: NOT DETECTED Log cps/mL

## 2021-12-10 ENCOUNTER — Telehealth: Payer: Self-pay

## 2021-12-10 NOTE — Telephone Encounter (Signed)
FYI Spoke with patient regarding lab results. Will discuss lab results in more depth with MD at follow up visit. Would like to discuss Egfr and creatine. Leatrice Jewels, RMA

## 2021-12-10 NOTE — Telephone Encounter (Signed)
Patient called office today requesting call back from MD to review labs from earlier this week. Will forward message to provider. Leatrice Jewels, RMA
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
## 2021-12-15 ENCOUNTER — Encounter: Payer: Self-pay | Admitting: Infectious Disease

## 2021-12-15 ENCOUNTER — Other Ambulatory Visit: Payer: Self-pay

## 2021-12-15 ENCOUNTER — Other Ambulatory Visit (HOSPITAL_COMMUNITY): Payer: Self-pay

## 2021-12-15 ENCOUNTER — Telehealth (INDEPENDENT_AMBULATORY_CARE_PROVIDER_SITE_OTHER): Payer: 59 | Admitting: Infectious Disease

## 2021-12-15 DIAGNOSIS — B20 Human immunodeficiency virus [HIV] disease: Secondary | ICD-10-CM | POA: Diagnosis not present

## 2021-12-15 DIAGNOSIS — R635 Abnormal weight gain: Secondary | ICD-10-CM | POA: Diagnosis not present

## 2021-12-15 DIAGNOSIS — B009 Herpesviral infection, unspecified: Secondary | ICD-10-CM

## 2021-12-15 MED ORDER — VALACYCLOVIR HCL 1 G PO TABS
1.0000 g | ORAL_TABLET | Freq: Two times a day (BID) | ORAL | 4 refills | Status: DC | PRN
  Filled 2021-12-15: qty 20, 10d supply, fill #0

## 2021-12-15 MED ORDER — EMTRICITABINE-TENOFOVIR AF 200-25 MG PO TABS
1.0000 | ORAL_TABLET | Freq: Every day | ORAL | 11 refills | Status: DC
  Filled 2021-12-15 – 2022-12-08 (×2): qty 30, 30d supply, fill #0

## 2021-12-15 MED ORDER — DORAVIRINE 100 MG PO TABS
ORAL_TABLET | Freq: Every day | ORAL | 11 refills | Status: DC
  Filled 2021-12-15: qty 30, fill #0
  Filled 2022-08-13: qty 30, 30d supply, fill #0

## 2021-12-15 MED ORDER — DESCOVY 200-25 MG PO TABS
1.0000 | ORAL_TABLET | Freq: Every day | ORAL | 11 refills | Status: DC
  Filled 2021-12-15 – 2021-12-17 (×2): qty 30, 30d supply, fill #0
  Filled 2022-01-16: qty 30, 30d supply, fill #1
  Filled 2022-02-10: qty 30, 30d supply, fill #2
  Filled 2022-03-12: qty 30, 30d supply, fill #3
  Filled 2022-04-08: qty 30, 30d supply, fill #4
  Filled 2022-05-14: qty 30, 30d supply, fill #5
  Filled 2022-06-12: qty 30, 30d supply, fill #6
  Filled 2022-07-13: qty 30, 30d supply, fill #7
  Filled 2022-08-13: qty 30, 30d supply, fill #8
  Filled 2022-09-14: qty 30, 30d supply, fill #9
  Filled 2022-10-13 – 2022-10-19 (×2): qty 30, 30d supply, fill #10
  Filled 2022-11-10: qty 30, 30d supply, fill #11

## 2021-12-15 MED ORDER — DORAVIRINE 100 MG PO TABS
ORAL_TABLET | ORAL | 11 refills | Status: DC
  Filled 2021-12-15: qty 30, fill #0
  Filled 2021-12-17: qty 30, 30d supply, fill #0
  Filled 2022-01-16: qty 30, 30d supply, fill #1
  Filled 2022-02-10: qty 30, 30d supply, fill #2
  Filled 2022-03-12: qty 30, 30d supply, fill #3
  Filled 2022-04-08: qty 30, 30d supply, fill #4
  Filled 2022-05-14: qty 30, 30d supply, fill #5
  Filled 2022-06-12: qty 30, 30d supply, fill #6
  Filled 2022-07-13: qty 30, 30d supply, fill #7
  Filled 2022-09-14: qty 30, 30d supply, fill #8
  Filled 2022-10-13 – 2022-10-19 (×2): qty 30, 30d supply, fill #9
  Filled 2022-11-10: qty 30, 30d supply, fill #10
  Filled 2022-12-08: qty 30, 30d supply, fill #11

## 2021-12-17 ENCOUNTER — Other Ambulatory Visit (HOSPITAL_COMMUNITY): Payer: Self-pay

## 2021-12-19 ENCOUNTER — Other Ambulatory Visit (HOSPITAL_COMMUNITY): Payer: Self-pay

## 2022-01-16 ENCOUNTER — Other Ambulatory Visit (HOSPITAL_COMMUNITY): Payer: Self-pay

## 2022-01-20 ENCOUNTER — Other Ambulatory Visit (HOSPITAL_COMMUNITY): Payer: Self-pay

## 2022-02-10 ENCOUNTER — Other Ambulatory Visit (HOSPITAL_COMMUNITY): Payer: Self-pay

## 2022-02-18 ENCOUNTER — Other Ambulatory Visit (HOSPITAL_COMMUNITY): Payer: Self-pay

## 2022-03-12 ENCOUNTER — Other Ambulatory Visit (HOSPITAL_COMMUNITY): Payer: Self-pay

## 2022-03-19 ENCOUNTER — Other Ambulatory Visit (HOSPITAL_COMMUNITY): Payer: Self-pay

## 2022-04-08 ENCOUNTER — Other Ambulatory Visit (HOSPITAL_COMMUNITY): Payer: Self-pay

## 2022-04-10 ENCOUNTER — Other Ambulatory Visit (HOSPITAL_COMMUNITY): Payer: Self-pay

## 2022-04-20 ENCOUNTER — Ambulatory Visit: Payer: 59 | Admitting: Infectious Disease

## 2022-04-27 ENCOUNTER — Other Ambulatory Visit (HOSPITAL_COMMUNITY): Payer: Self-pay

## 2022-04-30 ENCOUNTER — Other Ambulatory Visit (HOSPITAL_COMMUNITY): Payer: Self-pay

## 2022-05-01 ENCOUNTER — Telehealth: Payer: 59 | Admitting: Infectious Disease

## 2022-05-14 ENCOUNTER — Other Ambulatory Visit (HOSPITAL_COMMUNITY): Payer: Self-pay

## 2022-05-20 ENCOUNTER — Other Ambulatory Visit (HOSPITAL_COMMUNITY): Payer: Self-pay

## 2022-06-12 ENCOUNTER — Other Ambulatory Visit (HOSPITAL_COMMUNITY): Payer: Self-pay

## 2022-06-18 ENCOUNTER — Other Ambulatory Visit (HOSPITAL_COMMUNITY): Payer: Self-pay

## 2022-07-13 ENCOUNTER — Other Ambulatory Visit (HOSPITAL_COMMUNITY): Payer: Self-pay

## 2022-07-23 ENCOUNTER — Other Ambulatory Visit (HOSPITAL_COMMUNITY): Payer: Self-pay

## 2022-08-13 ENCOUNTER — Other Ambulatory Visit (HOSPITAL_COMMUNITY): Payer: Self-pay

## 2022-08-20 ENCOUNTER — Other Ambulatory Visit (HOSPITAL_COMMUNITY): Payer: Self-pay

## 2022-09-14 ENCOUNTER — Other Ambulatory Visit (HOSPITAL_COMMUNITY): Payer: Self-pay

## 2022-09-18 ENCOUNTER — Other Ambulatory Visit (HOSPITAL_COMMUNITY): Payer: Self-pay

## 2022-09-21 ENCOUNTER — Other Ambulatory Visit (HOSPITAL_COMMUNITY): Payer: Self-pay

## 2022-10-13 ENCOUNTER — Other Ambulatory Visit (HOSPITAL_COMMUNITY): Payer: Self-pay

## 2022-10-19 ENCOUNTER — Other Ambulatory Visit (HOSPITAL_COMMUNITY): Payer: Self-pay

## 2022-10-20 ENCOUNTER — Other Ambulatory Visit (HOSPITAL_COMMUNITY): Payer: Self-pay

## 2022-11-10 ENCOUNTER — Other Ambulatory Visit (HOSPITAL_COMMUNITY): Payer: Self-pay

## 2022-11-16 ENCOUNTER — Other Ambulatory Visit: Payer: Self-pay

## 2022-12-08 ENCOUNTER — Other Ambulatory Visit (HOSPITAL_COMMUNITY): Payer: Self-pay

## 2022-12-11 ENCOUNTER — Other Ambulatory Visit: Payer: 59

## 2022-12-15 ENCOUNTER — Other Ambulatory Visit (HOSPITAL_COMMUNITY): Payer: Self-pay

## 2022-12-16 ENCOUNTER — Other Ambulatory Visit: Payer: Self-pay

## 2022-12-29 ENCOUNTER — Telehealth: Payer: Self-pay

## 2022-12-29 NOTE — Telephone Encounter (Signed)
No answer left voice message

## 2022-12-29 NOTE — Telephone Encounter (Signed)
Patient called stating that she was dx with basal cell carcinoma on her back and the treatment plan will be for her to have remote radiation 3 times a week for 6 weeks. Patient has a concern about if this will affect her immune system.

## 2023-01-14 ENCOUNTER — Other Ambulatory Visit (HOSPITAL_COMMUNITY): Payer: Self-pay

## 2023-01-14 ENCOUNTER — Other Ambulatory Visit: Payer: Self-pay | Admitting: Infectious Disease

## 2023-01-14 ENCOUNTER — Other Ambulatory Visit: Payer: Self-pay

## 2023-01-14 DIAGNOSIS — B20 Human immunodeficiency virus [HIV] disease: Secondary | ICD-10-CM

## 2023-01-14 MED ORDER — DESCOVY 200-25 MG PO TABS
1.0000 | ORAL_TABLET | Freq: Every day | ORAL | 0 refills | Status: DC
  Filled 2023-01-14 (×2): qty 30, 30d supply, fill #0

## 2023-01-14 MED ORDER — DORAVIRINE 100 MG PO TABS
ORAL_TABLET | Freq: Every day | ORAL | 0 refills | Status: DC
  Filled 2023-01-14: qty 30, 30d supply, fill #0

## 2023-01-18 ENCOUNTER — Other Ambulatory Visit (HOSPITAL_COMMUNITY): Payer: Self-pay

## 2023-01-20 ENCOUNTER — Other Ambulatory Visit (HOSPITAL_COMMUNITY): Payer: Self-pay

## 2023-02-11 ENCOUNTER — Other Ambulatory Visit: Payer: Self-pay | Admitting: Infectious Disease

## 2023-02-11 ENCOUNTER — Other Ambulatory Visit (HOSPITAL_COMMUNITY): Payer: Self-pay

## 2023-02-11 ENCOUNTER — Other Ambulatory Visit: Payer: Self-pay

## 2023-02-11 DIAGNOSIS — B20 Human immunodeficiency virus [HIV] disease: Secondary | ICD-10-CM

## 2023-02-11 MED ORDER — DESCOVY 200-25 MG PO TABS
1.0000 | ORAL_TABLET | Freq: Every day | ORAL | 0 refills | Status: DC
Start: 2023-02-11 — End: 2023-03-17
  Filled 2023-02-11: qty 30, 30d supply, fill #0

## 2023-02-11 MED ORDER — DORAVIRINE 100 MG PO TABS
ORAL_TABLET | Freq: Every day | ORAL | 0 refills | Status: DC
Start: 2023-02-11 — End: 2023-03-17
  Filled 2023-02-11: qty 30, 30d supply, fill #0

## 2023-02-16 ENCOUNTER — Other Ambulatory Visit: Payer: Self-pay

## 2023-03-16 ENCOUNTER — Other Ambulatory Visit (HOSPITAL_COMMUNITY): Payer: Self-pay

## 2023-03-17 ENCOUNTER — Other Ambulatory Visit (HOSPITAL_COMMUNITY): Payer: Self-pay

## 2023-03-17 ENCOUNTER — Other Ambulatory Visit: Payer: Self-pay | Admitting: Infectious Disease

## 2023-03-17 DIAGNOSIS — B20 Human immunodeficiency virus [HIV] disease: Secondary | ICD-10-CM

## 2023-03-17 NOTE — Telephone Encounter (Signed)
Pt due for appt. My chart message sent.

## 2023-03-18 ENCOUNTER — Other Ambulatory Visit (HOSPITAL_COMMUNITY): Payer: Self-pay

## 2023-03-18 MED ORDER — DESCOVY 200-25 MG PO TABS
1.0000 | ORAL_TABLET | Freq: Every day | ORAL | 5 refills | Status: DC
Start: 2023-03-18 — End: 2023-09-13
  Filled 2023-03-18: qty 30, 30d supply, fill #0
  Filled 2023-04-15: qty 30, 30d supply, fill #1
  Filled 2023-05-14: qty 30, 30d supply, fill #2
  Filled 2023-06-16: qty 30, 30d supply, fill #3
  Filled 2023-07-12: qty 30, 30d supply, fill #4
  Filled 2023-08-06: qty 30, 30d supply, fill #5

## 2023-03-18 MED ORDER — DORAVIRINE 100 MG PO TABS
ORAL_TABLET | Freq: Every day | ORAL | 5 refills | Status: DC
Start: 2023-03-18 — End: 2023-09-13
  Filled 2023-03-18: qty 30, 30d supply, fill #0
  Filled 2023-04-15: qty 30, 30d supply, fill #1
  Filled 2023-05-14: qty 30, 30d supply, fill #2
  Filled 2023-06-16: qty 30, 30d supply, fill #3
  Filled 2023-07-12: qty 30, 30d supply, fill #4
  Filled 2023-08-06: qty 30, 30d supply, fill #5

## 2023-04-12 ENCOUNTER — Other Ambulatory Visit: Payer: Self-pay

## 2023-04-12 DIAGNOSIS — B20 Human immunodeficiency virus [HIV] disease: Secondary | ICD-10-CM

## 2023-04-12 DIAGNOSIS — Z113 Encounter for screening for infections with a predominantly sexual mode of transmission: Secondary | ICD-10-CM

## 2023-04-15 ENCOUNTER — Other Ambulatory Visit (HOSPITAL_COMMUNITY)
Admission: RE | Admit: 2023-04-15 | Discharge: 2023-04-15 | Disposition: A | Discharge status: Home / Self Care | Payer: 59 | Source: Ambulatory Visit | Attending: Infectious Disease | Admitting: Infectious Disease

## 2023-04-15 ENCOUNTER — Other Ambulatory Visit: Payer: 59

## 2023-04-15 ENCOUNTER — Other Ambulatory Visit: Payer: Self-pay

## 2023-04-15 ENCOUNTER — Other Ambulatory Visit (HOSPITAL_COMMUNITY): Payer: Self-pay

## 2023-04-15 DIAGNOSIS — B20 Human immunodeficiency virus [HIV] disease: Secondary | ICD-10-CM | POA: Diagnosis present

## 2023-04-15 DIAGNOSIS — Z113 Encounter for screening for infections with a predominantly sexual mode of transmission: Secondary | ICD-10-CM | POA: Insufficient documentation

## 2023-04-15 LAB — CBC WITH DIFFERENTIAL/PLATELET
Basophils Relative: 0.9 %
MCV: 96 fL (ref 80.0–100.0)
MPV: 9.2 fL (ref 7.5–12.5)
Monocytes Relative: 5 %
WBC: 6.6 10*3/uL (ref 3.8–10.8)

## 2023-04-16 LAB — COMPLETE METABOLIC PANEL WITH GFR
AST: 14 U/L (ref 10–35)
CO2: 27 mmol/L (ref 20–32)
Calcium: 9 mg/dL (ref 8.6–10.4)
Glucose, Bld: 90 mg/dL (ref 65–99)
Sodium: 142 mmol/L (ref 135–146)
eGFR: 77 mL/min/{1.73_m2} (ref >=60)

## 2023-04-16 LAB — URINE CYTOLOGY ANCILLARY ONLY
Chlamydia: NEGATIVE
Comment: NEGATIVE
Comment: NORMAL
Neisseria Gonorrhea: NEGATIVE

## 2023-04-16 LAB — LIPID PANEL
LDL Cholesterol (Calc): 75 mg/dL (calc)
Total CHOL/HDL Ratio: 2.7 (calc) (ref <5.0)

## 2023-04-16 LAB — T-HELPER CELLS (CD4) COUNT (NOT AT ARMC)
Absolute CD4: 825 cells/uL (ref 490–1740)
CD4 T Helper %: 40 % (ref 30–61)
Total lymphocyte count: 2050 cells/uL (ref 850–3900)

## 2023-04-16 LAB — RPR: RPR Ser Ql: NONREACTIVE

## 2023-04-17 LAB — COMPLETE METABOLIC PANEL WITH GFR
AG Ratio: 1.8 (calc) (ref 1.0–2.5)
ALT: 9 U/L (ref 6–29)
Albumin: 4.2 g/dL (ref 3.6–5.1)
Alkaline phosphatase (APISO): 65 U/L (ref 37–153)
BUN: 14 mg/dL (ref 7–25)
Chloride: 107 mmol/L (ref 98–110)
Creat: 0.88 mg/dL (ref 0.50–1.03)
Globulin: 2.4 g/dL (calc) (ref 1.9–3.7)
Potassium: 3.9 mmol/L (ref 3.5–5.3)
Total Bilirubin: 0.5 mg/dL (ref 0.2–1.2)
Total Protein: 6.6 g/dL (ref 6.1–8.1)

## 2023-04-17 LAB — CBC WITH DIFFERENTIAL/PLATELET
Absolute Monocytes: 330 cells/uL (ref 200–950)
Basophils Absolute: 59 cells/uL (ref 0–200)
Eosinophils Absolute: 92 cells/uL (ref 15–500)
Eosinophils Relative: 1.4 %
HCT: 38.7 % (ref 35.0–45.0)
Hemoglobin: 12.7 g/dL (ref 11.7–15.5)
Lymphs Abs: 1973 cells/uL (ref 850–3900)
MCH: 31.5 pg (ref 27.0–33.0)
MCHC: 32.8 g/dL (ref 32.0–36.0)
Neutro Abs: 4145 cells/uL (ref 1500–7800)
Neutrophils Relative %: 62.8 %
Platelets: 277 10*3/uL (ref 140–400)
RBC: 4.03 10*6/uL (ref 3.80–5.10)
RDW: 11.7 % (ref 11.0–15.0)
Total Lymphocyte: 29.9 %

## 2023-04-17 LAB — HIV-1 RNA QUANT-NO REFLEX-BLD
HIV 1 RNA Quant: NOT DETECTED Copies/mL
HIV-1 RNA Quant, Log: NOT DETECTED Log cps/mL

## 2023-04-17 LAB — LIPID PANEL
Cholesterol: 150 mg/dL (ref <200)
HDL: 56 mg/dL (ref >=50)
Non-HDL Cholesterol (Calc): 94 mg/dL (calc) (ref <130)
Triglycerides: 103 mg/dL (ref <150)

## 2023-04-20 ENCOUNTER — Other Ambulatory Visit (HOSPITAL_COMMUNITY): Payer: Self-pay

## 2023-05-14 ENCOUNTER — Other Ambulatory Visit (HOSPITAL_COMMUNITY): Payer: Self-pay

## 2023-05-19 ENCOUNTER — Other Ambulatory Visit (HOSPITAL_COMMUNITY): Payer: Self-pay

## 2023-06-16 ENCOUNTER — Other Ambulatory Visit (HOSPITAL_COMMUNITY): Payer: Self-pay

## 2023-06-18 ENCOUNTER — Other Ambulatory Visit (HOSPITAL_COMMUNITY): Payer: Self-pay

## 2023-06-20 ENCOUNTER — Encounter: Payer: Self-pay | Admitting: Infectious Disease

## 2023-07-12 ENCOUNTER — Other Ambulatory Visit (HOSPITAL_COMMUNITY): Payer: Self-pay

## 2023-07-12 ENCOUNTER — Other Ambulatory Visit: Payer: Self-pay

## 2023-07-16 NOTE — Progress Notes (Signed)
The ASCVD Risk score (Arnett DK, et al., 2019) failed to calculate for the following reasons:   The systolic blood pressure is missing  Sandie Ano, RN

## 2023-08-06 ENCOUNTER — Other Ambulatory Visit: Payer: Self-pay

## 2023-08-06 NOTE — Progress Notes (Signed)
Specialty Pharmacy Refill Coordination Note  Sue Mendez is a 57 y.o. female contacted today regarding refills of specialty medication(s) Doravirine; Emtricitabine-Tenofovir Af   Patient requested Delivery   Delivery date: 08/17/23   Verified address: [address] [facility] [address]   Medication will be filled on 08/16/23.

## 2023-08-11 ENCOUNTER — Other Ambulatory Visit (HOSPITAL_BASED_OUTPATIENT_CLINIC_OR_DEPARTMENT_OTHER): Payer: Self-pay

## 2023-09-08 ENCOUNTER — Other Ambulatory Visit: Payer: Self-pay

## 2023-09-10 ENCOUNTER — Other Ambulatory Visit: Payer: Self-pay

## 2023-09-13 ENCOUNTER — Other Ambulatory Visit: Payer: Self-pay

## 2023-09-13 ENCOUNTER — Other Ambulatory Visit: Payer: Self-pay | Admitting: Infectious Disease

## 2023-09-13 ENCOUNTER — Other Ambulatory Visit (HOSPITAL_COMMUNITY): Payer: Self-pay

## 2023-09-13 DIAGNOSIS — B20 Human immunodeficiency virus [HIV] disease: Secondary | ICD-10-CM

## 2023-09-13 MED ORDER — DESCOVY 200-25 MG PO TABS
1.0000 | ORAL_TABLET | Freq: Every day | ORAL | 0 refills | Status: DC
  Filled 2023-09-13: qty 30, 30d supply, fill #0

## 2023-09-13 MED ORDER — DORAVIRINE 100 MG PO TABS
ORAL_TABLET | Freq: Every day | ORAL | 0 refills | Status: DC
Start: 2023-09-13 — End: 2023-09-28
  Filled 2023-09-13: qty 30, 30d supply, fill #0

## 2023-09-13 NOTE — Progress Notes (Signed)
Specialty Pharmacy Refill Coordination Note  Sue Mendez is a 57 y.o. female contacted today regarding refills of specialty medication(s) Doravirine; Emtricitabine-Tenofovir Af   Patient requested Delivery   Delivery date: 09/17/23   Verified address: [address] [facility] [address]   Medication will be filled on 09/16/23.  Refill request pending.

## 2023-09-16 ENCOUNTER — Ambulatory Visit: Payer: 59 | Admitting: Infectious Disease

## 2023-09-27 ENCOUNTER — Encounter: Payer: Self-pay | Admitting: Infectious Disease

## 2023-09-27 DIAGNOSIS — E785 Hyperlipidemia, unspecified: Secondary | ICD-10-CM

## 2023-09-27 DIAGNOSIS — Z7185 Encounter for immunization safety counseling: Secondary | ICD-10-CM

## 2023-09-27 HISTORY — DX: Hyperlipidemia, unspecified: E78.5

## 2023-09-27 HISTORY — DX: Encounter for immunization safety counseling: Z71.85
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
## 2023-09-28 ENCOUNTER — Ambulatory Visit (INDEPENDENT_AMBULATORY_CARE_PROVIDER_SITE_OTHER): Payer: 59 | Admitting: Infectious Disease

## 2023-09-28 ENCOUNTER — Other Ambulatory Visit (HOSPITAL_COMMUNITY): Payer: Self-pay

## 2023-09-28 ENCOUNTER — Other Ambulatory Visit: Payer: Self-pay

## 2023-09-28 ENCOUNTER — Encounter: Payer: Self-pay | Admitting: Infectious Disease

## 2023-09-28 VITALS — BP 110/75 | HR 74 | Temp 97.4°F | Ht 63.0 in | Wt 191.0 lb

## 2023-09-28 DIAGNOSIS — B20 Human immunodeficiency virus [HIV] disease: Secondary | ICD-10-CM

## 2023-09-28 DIAGNOSIS — B009 Herpesviral infection, unspecified: Secondary | ICD-10-CM | POA: Diagnosis not present

## 2023-09-28 DIAGNOSIS — E785 Hyperlipidemia, unspecified: Secondary | ICD-10-CM

## 2023-09-28 DIAGNOSIS — R635 Abnormal weight gain: Secondary | ICD-10-CM | POA: Diagnosis not present

## 2023-09-28 DIAGNOSIS — Z7185 Encounter for immunization safety counseling: Secondary | ICD-10-CM

## 2023-09-28 MED ORDER — VALACYCLOVIR HCL 1 G PO TABS
1.0000 g | ORAL_TABLET | Freq: Two times a day (BID) | ORAL | 4 refills | Status: DC | PRN
  Filled 2023-09-28 – 2023-11-09 (×2): qty 20, 10d supply, fill #0

## 2023-09-28 MED ORDER — DESCOVY 200-25 MG PO TABS
1.0000 | ORAL_TABLET | Freq: Every day | ORAL | 0 refills | Status: DC
  Filled 2023-09-28 – 2023-10-04 (×2): qty 30, 30d supply, fill #0

## 2023-09-28 MED ORDER — DORAVIRINE 100 MG PO TABS
ORAL_TABLET | Freq: Every day | ORAL | 0 refills | Status: DC
  Filled 2023-09-28: qty 30, fill #0
  Filled 2023-10-04: qty 30, 30d supply, fill #0

## 2023-09-28 MED ORDER — ATORVASTATIN CALCIUM 10 MG PO TABS
10.0000 mg | ORAL_TABLET | Freq: Every day | ORAL | 11 refills | Status: DC
  Filled 2023-09-28 – 2023-11-08 (×2): qty 30, 30d supply, fill #0
  Filled 2024-05-08: qty 30, 30d supply, fill #1

## 2023-09-28 NOTE — Addendum Note (Signed)
Addended by: Philippa [place] on: 09/28/2023 09:54 AM   Modules accepted: Orders

## 2023-09-28 NOTE — Addendum Note (Signed)
Addended by: Harley Alto on: 09/28/2023 10:08 AM   Modules accepted: Orders

## 2023-09-29 LAB — T-HELPER CELLS (CD4) COUNT (NOT AT ARMC)
CD4 % Helper T Cell: 36 % (ref 33–65)
CD4 T Cell Abs: 617 /uL (ref 400–1790)

## 2023-09-30 LAB — CBC WITH DIFFERENTIAL/PLATELET
Absolute Lymphocytes: 1885 {cells}/uL (ref 850–3900)
Absolute Monocytes: 451 {cells}/uL (ref 200–950)
Basophils Absolute: 49 {cells}/uL (ref 0–200)
Basophils Relative: 0.8 %
Eosinophils Absolute: 128 {cells}/uL (ref 15–500)
Eosinophils Relative: 2.1 %
HCT: 37.2 % (ref 35.0–45.0)
Hemoglobin: 12.1 g/dL (ref 11.7–15.5)
MCH: 30.7 pg (ref 27.0–33.0)
MCHC: 32.5 g/dL (ref 32.0–36.0)
MCV: 94.4 fL (ref 80.0–100.0)
MPV: 9.4 fL (ref 7.5–12.5)
Monocytes Relative: 7.4 %
Neutro Abs: 3587 {cells}/uL (ref 1500–7800)
Neutrophils Relative %: 58.8 %
Platelets: 259 10*3/uL (ref 140–400)
RBC: 3.94 10*6/uL (ref 3.80–5.10)
RDW: 11.7 % (ref 11.0–15.0)
Total Lymphocyte: 30.9 %
WBC: 6.1 10*3/uL (ref 3.8–10.8)

## 2023-09-30 LAB — LIPID PANEL
Cholesterol: 139 mg/dL (ref <200)
HDL: 52 mg/dL (ref >=50)
LDL Cholesterol (Calc): 64 mg/dL
Non-HDL Cholesterol (Calc): 87 mg/dL (ref <130)
Total CHOL/HDL Ratio: 2.7 (calc) (ref <5.0)
Triglycerides: 144 mg/dL (ref <150)

## 2023-09-30 LAB — COMPLETE METABOLIC PANEL WITH GFR
AG Ratio: 1.8 (calc) (ref 1.0–2.5)
ALT: 11 U/L (ref 6–29)
AST: 18 U/L (ref 10–35)
Albumin: 4 g/dL (ref 3.6–5.1)
Alkaline phosphatase (APISO): 64 U/L (ref 37–153)
BUN: 10 mg/dL (ref 7–25)
CO2: 26 mmol/L (ref 20–32)
Calcium: 9 mg/dL (ref 8.6–10.4)
Chloride: 110 mmol/L (ref 98–110)
Creat: 0.94 mg/dL (ref 0.50–1.03)
Globulin: 2.2 g/dL (ref 1.9–3.7)
Glucose, Bld: 91 mg/dL (ref 65–99)
Potassium: 4.3 mmol/L (ref 3.5–5.3)
Sodium: 142 mmol/L (ref 135–146)
Total Bilirubin: 0.3 mg/dL (ref 0.2–1.2)
Total Protein: 6.2 g/dL (ref 6.1–8.1)
eGFR: 71 mL/min/{1.73_m2} (ref >=60)

## 2023-09-30 LAB — HIV-1 RNA QUANT-NO REFLEX-BLD
HIV 1 RNA Quant: NOT DETECTED {copies}/mL
HIV-1 RNA Quant, Log: NOT DETECTED {Log}

## 2023-10-01 ENCOUNTER — Other Ambulatory Visit: Payer: Self-pay

## 2023-10-04 ENCOUNTER — Other Ambulatory Visit (HOSPITAL_COMMUNITY): Payer: Self-pay | Admitting: Pharmacy Technician

## 2023-10-04 ENCOUNTER — Other Ambulatory Visit (HOSPITAL_COMMUNITY): Payer: Self-pay

## 2023-10-04 ENCOUNTER — Other Ambulatory Visit: Payer: Self-pay

## 2023-10-04 NOTE — Progress Notes (Signed)
Specialty Pharmacy Refill Coordination Note  Sue Mendez is a 57 y.o. female contacted today regarding refills of specialty medication(s) Doravirine; Emtricitabine-Tenofovir Af   Patient requested Delivery   Delivery date: 10/15/23   Verified address: [address] [place]   Medication will be filled on 10/14/23.

## 2023-10-14 ENCOUNTER — Telehealth: Payer: Self-pay

## 2023-10-14 ENCOUNTER — Other Ambulatory Visit: Payer: Self-pay

## 2023-10-14 ENCOUNTER — Other Ambulatory Visit (HOSPITAL_COMMUNITY): Payer: Self-pay

## 2023-10-14 NOTE — Telephone Encounter (Signed)
[facility] Patient Advocate Encounter °  °Was successful in obtaining a [employer] copay card for Descovy.  This copay card will make the patients copay 0.00. ° °I have spoken with the patient.   ° °The billing information is as follows and has been shared with [facility]. ° ° ° ° ° ° °Garrison Michie, CPhT °Specialty Pharmacy Patient Advocate °[facility] °Phone: [phone] °Fax:  [phone]  °

## 2023-11-08 ENCOUNTER — Other Ambulatory Visit: Payer: Self-pay | Admitting: Infectious Disease

## 2023-11-08 ENCOUNTER — Other Ambulatory Visit: Payer: Self-pay

## 2023-11-08 DIAGNOSIS — B20 Human immunodeficiency virus [HIV] disease: Secondary | ICD-10-CM

## 2023-11-08 MED ORDER — DORAVIRINE 100 MG PO TABS
ORAL_TABLET | Freq: Every day | ORAL | 8 refills | Status: DC
  Filled 2023-11-08: qty 30, fill #0
  Filled 2023-11-09: qty 30, 30d supply, fill #0
  Filled 2023-12-10: qty 30, 30d supply, fill #1
  Filled 2024-01-07: qty 30, 30d supply, fill #2
  Filled 2024-02-10: qty 30, 30d supply, fill #3
  Filled 2024-03-21: qty 30, 30d supply, fill #4
  Filled 2024-04-17: qty 30, 30d supply, fill #5
  Filled 2024-05-08 – 2024-05-22 (×3): qty 30, 30d supply, fill #6
  Filled 2024-06-09 – 2024-06-12 (×2): qty 30, 30d supply, fill #7
  Filled 2024-07-10: qty 30, 30d supply, fill #8

## 2023-11-08 MED ORDER — DESCOVY 200-25 MG PO TABS
1.0000 | ORAL_TABLET | Freq: Every day | ORAL | 8 refills | Status: DC
  Filled 2023-11-08 – 2023-11-09 (×2): qty 30, 30d supply, fill #0
  Filled 2023-12-10: qty 30, 30d supply, fill #1
  Filled 2024-01-07: qty 30, 30d supply, fill #2
  Filled 2024-02-10: qty 30, 30d supply, fill #3
  Filled 2024-03-21: qty 30, 30d supply, fill #4
  Filled 2024-04-17: qty 30, 30d supply, fill #5
  Filled 2024-05-08 – 2024-05-22 (×3): qty 30, 30d supply, fill #6
  Filled 2024-06-09 – 2024-06-12 (×2): qty 30, 30d supply, fill #7
  Filled 2024-07-10: qty 30, 30d supply, fill #8

## 2023-11-09 ENCOUNTER — Other Ambulatory Visit (HOSPITAL_COMMUNITY): Payer: Self-pay

## 2023-11-09 ENCOUNTER — Other Ambulatory Visit: Payer: Self-pay

## 2023-11-09 NOTE — Progress Notes (Signed)
 Specialty Pharmacy Refill Coordination Note  Sue Mendez is a 58 y.o. female contacted today regarding refills of specialty medication(s) Doravirine  (PIFELTRO ); Emtricitabine -Tenofovir  AF (Descovy )   Patient requested Delivery   Delivery date: 11/19/23   Verified address: [address] [place] [address]   Medication will be filled on 11/18/23.

## 2023-11-18 ENCOUNTER — Other Ambulatory Visit: Payer: Self-pay

## 2023-12-10 ENCOUNTER — Other Ambulatory Visit (HOSPITAL_COMMUNITY): Payer: Self-pay

## 2023-12-10 ENCOUNTER — Other Ambulatory Visit: Payer: Self-pay

## 2023-12-10 NOTE — Progress Notes (Signed)
 Specialty Pharmacy Refill Coordination Note  Sue Mendez is a 58 y.o. female contacted today regarding refills of specialty medication(s) Doravirine  (PIFELTRO ); Emtricitabine -Tenofovir  AF (Descovy )   Patient requested Delivery   Delivery date: 12/21/23   Verified address: [address] [place] [facility] [address]   Medication will be filled on 02.17.25.

## 2023-12-15 ENCOUNTER — Other Ambulatory Visit: Payer: Self-pay

## 2024-01-07 ENCOUNTER — Other Ambulatory Visit: Payer: Self-pay

## 2024-01-07 NOTE — Progress Notes (Signed)
 Specialty Pharmacy Refill Coordination Note  Sue Mendez is a 58 y.o. female contacted today regarding refills of specialty medication(s) Doravirine (PIFELTRO); Emtricitabine-Tenofovir AF (Descovy)   Patient requested Delivery   Delivery date: 01/14/24   Verified address: [address] [place] [address]   Medication will be filled on 01/13/24.

## 2024-01-07 NOTE — Progress Notes (Signed)
 Specialty Pharmacy Ongoing Clinical Assessment Note  Sue Mendez is a 58 y.o. female who is being followed by the specialty pharmacy service for RxSp HIV   Patient's specialty medication(s) reviewed today: Doravirine (PIFELTRO); Emtricitabine-Tenofovir AF (Descovy)   Missed doses in the last 4 weeks: 0   Patient/Caregiver did not have any additional questions or concerns.   Therapeutic benefit summary: Patient is achieving benefit (09/28/23 HIV RNA Not Detected)   Adverse events/side effects summary: No adverse events/side effects   Patient's therapy is appropriate to: Continue    Goals Addressed             This Visit's Progress    Achieve Undetectable HIV Viral Load < 20       Patient is on track. Patient will maintain adherence         Follow up:  6 months  Bobette Mo Specialty Pharmacist

## 2024-02-09 ENCOUNTER — Other Ambulatory Visit: Payer: Self-pay

## 2024-02-10 ENCOUNTER — Other Ambulatory Visit: Payer: Self-pay

## 2024-02-10 NOTE — Progress Notes (Signed)
 Specialty Pharmacy Refill Coordination Note  Sue Mendez is a 58 y.o. female contacted today regarding refills of specialty medication(s) Doravirine (PIFELTRO); Emtricitabine-Tenofovir AF (Descovy)   Patient requested (Patient-Rptd) Delivery   Delivery date: 02/21/24   Verified address: (Patient-Rptd) [address]  [place] [address]   Medication will be filled on 02/18/24.

## 2024-02-17 ENCOUNTER — Other Ambulatory Visit (HOSPITAL_COMMUNITY): Payer: Self-pay

## 2024-02-18 ENCOUNTER — Other Ambulatory Visit: Payer: Self-pay

## 2024-03-21 ENCOUNTER — Other Ambulatory Visit: Payer: Self-pay

## 2024-03-21 ENCOUNTER — Other Ambulatory Visit (HOSPITAL_COMMUNITY): Payer: Self-pay

## 2024-03-21 NOTE — Progress Notes (Signed)
 Specialty Pharmacy Refill Coordination Note  Sue Mendez is a 58 y.o. female contacted today regarding refills of specialty medication(s) Doravirine  (PIFELTRO ); Emtricitabine -Tenofovir  AF (Descovy )   Patient requested Delivery   Delivery date: 03/23/24   Verified address: [address] [place] [address]   Medication will be filled on 03/22/24.

## 2024-03-22 ENCOUNTER — Other Ambulatory Visit (HOSPITAL_COMMUNITY): Payer: Self-pay

## 2024-03-22 ENCOUNTER — Other Ambulatory Visit: Payer: Self-pay

## 2024-04-13 ENCOUNTER — Other Ambulatory Visit: Payer: Self-pay

## 2024-04-16 ENCOUNTER — Encounter (INDEPENDENT_AMBULATORY_CARE_PROVIDER_SITE_OTHER): Payer: Self-pay

## 2024-04-17 ENCOUNTER — Other Ambulatory Visit: Payer: Self-pay

## 2024-04-17 ENCOUNTER — Other Ambulatory Visit (HOSPITAL_COMMUNITY): Payer: Self-pay

## 2024-04-17 NOTE — Progress Notes (Signed)
 Specialty Pharmacy Refill Coordination Note  MyChart Questionnaire Submission  Sue Mendez is a 58 y.o. female contacted today regarding refills of specialty medication(s) Descovy  & Pifeltro .  Patient requested (Patient-Rptd) Delivery   Delivery date: 04/19/24  Verified address: (Patient-Rptd) [address] [place] [address]  Medication will be filled on 04/18/24.

## 2024-05-09 ENCOUNTER — Telehealth: Payer: Self-pay | Admitting: Infectious Disease

## 2024-05-09 ENCOUNTER — Other Ambulatory Visit: Payer: Self-pay

## 2024-05-09 NOTE — Telephone Encounter (Signed)
 Sue Mendez called to schedule the next available appt with Dr. Fleeta Rothman. She stated she needs to speak with him as soon as possible. She requested clarity of her last follow-up visit which will help decide her next steps in life. I recommended her sending a MyChart message but she stated she has sent several and has not heard back. Sue Mendez is requesting a call or MyChart message for assistance. [name] is scheduled 8/11 for a MyChart visit and added to the wait list.

## 2024-05-15 ENCOUNTER — Other Ambulatory Visit: Payer: Self-pay

## 2024-05-18 ENCOUNTER — Other Ambulatory Visit: Payer: Self-pay

## 2024-05-20 ENCOUNTER — Encounter (INDEPENDENT_AMBULATORY_CARE_PROVIDER_SITE_OTHER): Payer: Self-pay

## 2024-05-22 ENCOUNTER — Other Ambulatory Visit: Payer: Self-pay

## 2024-05-22 NOTE — Progress Notes (Signed)
 Specialty Pharmacy Refill Coordination Note  Sue Mendez is a 58 y.o. female contacted today regarding refills of specialty medication(s) Doravirine  (PIFELTRO ); Emtricitabine -Tenofovir  AF (Descovy )   Patient requested Delivery   Delivery date: 05/23/24   Verified address: [address] [place] [address]   Medication will be filled on 07.21.25.

## 2024-06-09 ENCOUNTER — Other Ambulatory Visit: Payer: Self-pay

## 2024-06-11 NOTE — Progress Notes (Signed)
 Virtual Visit via Video Note  I connected with Sue Mendez on 06/11/24 at  2:15 PM EDT by a video enabled telemedicine application and verified that I am speaking with the correct person using two identifiers.  Location: Patient: Home Provider: [facility]   I discussed the limitations of evaluation and management by telemedicine and the availability of in person appointments. The patient expressed understanding and agreed to proceed.  History of Present Illness:    Discussed the use of AI scribe software for clinical note transcription with the patient, who gave verbal consent to proceed.  History of Present Illness   Sue Mendez is a 58 year old female with depression, anxiety, panic attacks, and HIV who presents seeking support for an ADA accommodation to work virtually due to job-related stress.  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 The work environment is overwhelming and not conducive to her mental health, often leaving her in tears. She is considering quitting her job but is exploring the possibility of obtaining an ADA accommodation to work virtually and transition to a non-exempt position to reduce stress and be closer to her family in [place] .  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 She finds the role overwhelming and is considering moving back to [place]  to be closer to family. She has started seeing a therapist and plans to begin sessions with a psychiatrist to manage her mental health conditions, which include depression, anxiety, and panic attacks. She is not currently on medication for these conditions, preferring to take fewer medications and engage in CBT.  She is living with HIV, which she feels contributes to her stress due to the associated stigma. She is currently taking Pifeltro  and  Descovy  for HIV management. She has been on Lipitor since February 2025 for hyperlipidemia.         Observations/Objective:  She was tearful throughout much of the interview esp when describing her struggles with depression, anxiety and panic attacks.  Assessment and Plan:  Assessment and Plan    Human immunodeficiency virus (HIV) infection HIV managed with Pifeltro  and Descovy . New single-pill regimen with Pifeltro  shows non-inferiority to Biktarvy, may improve adherence. - Continue Pifeltro  and Descovy . - Consider new single-pill regimen with Pifeltro  when available. --checking HIV RNA, CD4 and routine labs when she comes to clinic later this week  Dyslipidemia in the context of HIV infection Dyslipidemia managed with Lipitor. [employer] study showed 35% reduction in cardiovascular events with statins in HIV. - Continue Lipitor.  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 Prefers non-pharmacological interventions. Engaged with therapist, plans to see psychiatrist. - Complete ADA paperwork for virtual, non-exempt position. - Encourage continued therapy and psychiatric evaluation. --will offer other suggestions such as meditation at future visit        Follow Up Instructions:    I discussed the assessment and treatment plan with the patient. The patient was provided an opportunity to ask questions and all were answered. The patient agreed with the plan and demonstrated an understanding of the instructions.   The patient was advised to call back or seek an in-person evaluation if the symptoms worsen or if the condition fails to improve as anticipated.  I have personally spent involved in face-to-face and non-face-to-face  activities for this patient on the day of the visit. Professional time spent includes the following activities: Preparing to see the patient (review of tests),  Obtaining and/or reviewing separately obtained history (admission/discharge record), Performing a medically appropriate examination and/or evaluation , Ordering medications/tests/procedures, referring and communicating with other health care professionals, Documenting clinical information in the EMR, Independently interpreting results (not separately reported), Communicating results to the patient/family/caregiver, Counseling and educating the patient/family/caregiver and Care coordination (not separately reported).     Jomarie Fleeta Rothman, MD

## 2024-06-12 ENCOUNTER — Other Ambulatory Visit (HOSPITAL_COMMUNITY): Payer: Self-pay

## 2024-06-12 ENCOUNTER — Other Ambulatory Visit: Payer: Self-pay

## 2024-06-12 ENCOUNTER — Encounter (INDEPENDENT_AMBULATORY_CARE_PROVIDER_SITE_OTHER): Payer: Self-pay

## 2024-06-12 ENCOUNTER — Telehealth: Admitting: Infectious Disease

## 2024-06-12 DIAGNOSIS — F32A Depression, unspecified: Secondary | ICD-10-CM

## 2024-06-12 DIAGNOSIS — B20 Human immunodeficiency virus [HIV] disease: Secondary | ICD-10-CM

## 2024-06-12 DIAGNOSIS — F419 Anxiety disorder, unspecified: Secondary | ICD-10-CM | POA: Diagnosis not present

## 2024-06-12 DIAGNOSIS — E785 Hyperlipidemia, unspecified: Secondary | ICD-10-CM

## 2024-06-12 DIAGNOSIS — F329 Major depressive disorder, single episode, unspecified: Secondary | ICD-10-CM

## 2024-06-12 DIAGNOSIS — F410 Panic disorder [episodic paroxysmal anxiety] without agoraphobia: Secondary | ICD-10-CM

## 2024-06-12 DIAGNOSIS — Z7185 Encounter for immunization safety counseling: Secondary | ICD-10-CM

## 2024-06-12 NOTE — Addendum Note (Signed)
 Addended by: FLEETA KATHIE JOMARIE LOISE on: 06/12/2024 03:52 PM   Modules accepted: Orders

## 2024-06-12 NOTE — Progress Notes (Signed)
 Specialty Pharmacy Refill Coordination Note  MyChart Questionnaire Submission  Sue Mendez is a 58 y.o. female contacted today regarding refills of specialty medication(s) Descovy  AND Pifeltro .  Doses on hand: (Patient-Rptd) 12   Patient requested: (Patient-Rptd) Delivery   Delivery date: 06/15/24  Verified address: [address] [place] [address]  Medication will be filled on 06/14/24.

## 2024-06-14 ENCOUNTER — Other Ambulatory Visit (HOSPITAL_COMMUNITY)
Admission: RE | Admit: 2024-06-14 | Discharge: 2024-06-14 | Disposition: A | Discharge status: Home / Self Care | Source: Ambulatory Visit | Attending: Infectious Disease | Admitting: Infectious Disease

## 2024-06-14 ENCOUNTER — Other Ambulatory Visit

## 2024-06-14 ENCOUNTER — Other Ambulatory Visit: Payer: Self-pay

## 2024-06-14 DIAGNOSIS — B20 Human immunodeficiency virus [HIV] disease: Secondary | ICD-10-CM | POA: Diagnosis present

## 2024-06-14 DIAGNOSIS — E785 Hyperlipidemia, unspecified: Secondary | ICD-10-CM | POA: Insufficient documentation

## 2024-06-15 LAB — URINE CYTOLOGY ANCILLARY ONLY
Chlamydia: NEGATIVE
Comment: NEGATIVE
Comment: NORMAL
Neisseria Gonorrhea: NEGATIVE

## 2024-06-15 LAB — T-HELPER CELLS (CD4) COUNT (NOT AT ARMC)
CD4 % Helper T Cell: 36 % (ref 33–65)
CD4 T Cell Abs: 689 /uL (ref 400–1790)

## 2024-06-16 LAB — COMPLETE METABOLIC PANEL WITHOUT GFR
AG Ratio: 2 (calc) (ref 1.0–2.5)
ALT: 11 U/L (ref 6–29)
AST: 15 U/L (ref 10–35)
Albumin: 4.3 g/dL (ref 3.6–5.1)
Alkaline phosphatase (APISO): 66 U/L (ref 37–153)
BUN: 12 mg/dL (ref 7–25)
CO2: 29 mmol/L (ref 20–32)
Calcium: 9.1 mg/dL (ref 8.6–10.4)
Chloride: 106 mmol/L (ref 98–110)
Creat: 0.95 mg/dL (ref 0.50–1.03)
Globulin: 2.2 g/dL (ref 1.9–3.7)
Glucose, Bld: 84 mg/dL (ref 65–99)
Potassium: 4.1 mmol/L (ref 3.5–5.3)
Sodium: 143 mmol/L (ref 135–146)
Total Bilirubin: 0.5 mg/dL (ref 0.2–1.2)
Total Protein: 6.5 g/dL (ref 6.1–8.1)

## 2024-06-16 LAB — CBC WITH DIFFERENTIAL/PLATELET
Absolute Lymphocytes: 1879 {cells}/uL (ref 850–3900)
Absolute Monocytes: 499 {cells}/uL (ref 200–950)
Basophils Absolute: 70 {cells}/uL (ref 0–200)
Basophils Relative: 1.2 %
Eosinophils Absolute: 151 {cells}/uL (ref 15–500)
Eosinophils Relative: 2.6 %
HCT: 40.5 % (ref 35.0–45.0)
Hemoglobin: 13.3 g/dL (ref 11.7–15.5)
MCH: 31.4 pg (ref 27.0–33.0)
MCHC: 32.8 g/dL (ref 32.0–36.0)
MCV: 95.7 fL (ref 80.0–100.0)
MPV: 9.5 fL (ref 7.5–12.5)
Monocytes Relative: 8.6 %
Neutro Abs: 3202 {cells}/uL (ref 1500–7800)
Neutrophils Relative %: 55.2 %
Platelets: 276 Thousand/uL (ref 140–400)
RBC: 4.23 Million/uL (ref 3.80–5.10)
RDW: 12 % (ref 11.0–15.0)
Total Lymphocyte: 32.4 %
WBC: 5.8 Thousand/uL (ref 3.8–10.8)

## 2024-06-16 LAB — LIPID PANEL
Cholesterol: 163 mg/dL (ref <200)
HDL: 51 mg/dL (ref >=50)
LDL Cholesterol (Calc): 89 mg/dL
Non-HDL Cholesterol (Calc): 112 mg/dL (ref <130)
Total CHOL/HDL Ratio: 3.2 (calc) (ref <5.0)
Triglycerides: 133 mg/dL (ref <150)

## 2024-06-16 LAB — HIV-1 RNA QUANT-NO REFLEX-BLD
HIV 1 RNA Quant: NOT DETECTED {copies}/mL
HIV-1 RNA Quant, Log: NOT DETECTED {Log_copies}/mL

## 2024-06-16 LAB — RPR: RPR Ser Ql: NONREACTIVE

## 2024-06-22 ENCOUNTER — Encounter: Payer: Self-pay | Admitting: Infectious Disease

## 2024-06-26 ENCOUNTER — Telehealth: Payer: Self-pay

## 2024-06-26 NOTE — Telephone Encounter (Signed)
 Patient called stating her job is requesting leave of absence paperwork be filled out. ADA paperwork was previously filled out and will not work for her job. Are you ok filling this out dr. Fleeta Mendez

## 2024-06-27 NOTE — Telephone Encounter (Signed)
 Patient will email paperwork over to be filled out

## 2024-06-30 ENCOUNTER — Other Ambulatory Visit: Payer: Self-pay

## 2024-06-30 NOTE — Progress Notes (Signed)
 Specialty Pharmacy Ongoing Clinical Assessment Note  Sue Mendez is a 58 y.o. female who is being followed by the specialty pharmacy service for RxSp HIV   Patient's specialty medication(s) reviewed today: No data recorded  Missed doses in the last 4 weeks: 0   Patient/Caregiver did not have any additional questions or concerns.   Therapeutic benefit summary: Patient is achieving benefit   Adverse events/side effects summary: No adverse events/side effects   Patient's therapy is appropriate to: Continue    Goals Addressed             This Visit's Progress    Achieve Undetectable HIV Viral Load < 20   On track    Patient is on track. Patient will maintain adherence. HIV VL on 06/14/24 undetectable for over 4 years on therapy.         Follow up: 1 year  [name] [name] [name] Specialty Pharmacist

## 2024-07-10 ENCOUNTER — Other Ambulatory Visit: Payer: Self-pay

## 2024-07-10 ENCOUNTER — Other Ambulatory Visit (HOSPITAL_COMMUNITY): Payer: Self-pay

## 2024-07-10 NOTE — Progress Notes (Signed)
 Specialty Pharmacy Refill Coordination Note  COLETTA LOCKNER is a 58 y.o. female contacted today regarding refills of specialty medication(s) Doravirine  (PIFELTRO ); Emtricitabine -Tenofovir  AF (Descovy )   Patient requested Delivery   Delivery date: 07/12/24   Verified address: [address] [place] [address]   Medication will be filled on 07/11/24.     PATIENTS MEDICATION WAS STOLEN

## 2024-07-11 ENCOUNTER — Other Ambulatory Visit: Payer: Self-pay

## 2024-07-11 ENCOUNTER — Other Ambulatory Visit (HOSPITAL_COMMUNITY): Payer: Self-pay

## 2024-07-11 NOTE — Progress Notes (Signed)
 Medications were lost in transit and were returned to pharmacy today. Spoke with [facility] and both early refill and original fill will be shipped out to pt. Next refill call will be re-timed appropriately.

## 2024-07-12 ENCOUNTER — Ambulatory Visit: Payer: 59 | Admitting: Infectious Disease

## 2024-08-13 ENCOUNTER — Other Ambulatory Visit: Payer: Self-pay | Admitting: Infectious Disease

## 2024-08-13 DIAGNOSIS — B20 Human immunodeficiency virus [HIV] disease: Secondary | ICD-10-CM

## 2024-08-14 NOTE — Telephone Encounter (Signed)
Appt 10.15.

## 2024-08-15 ENCOUNTER — Encounter: Payer: Self-pay | Admitting: Infectious Disease

## 2024-08-15 DIAGNOSIS — F410 Panic disorder [episodic paroxysmal anxiety] without agoraphobia: Secondary | ICD-10-CM | POA: Insufficient documentation
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
## 2024-08-16 ENCOUNTER — Encounter: Payer: Self-pay | Admitting: Infectious Disease

## 2024-08-16 ENCOUNTER — Other Ambulatory Visit (HOSPITAL_COMMUNITY): Payer: Self-pay

## 2024-08-16 ENCOUNTER — Other Ambulatory Visit: Payer: Self-pay

## 2024-08-16 ENCOUNTER — Other Ambulatory Visit: Payer: Self-pay | Admitting: Infectious Disease

## 2024-08-16 ENCOUNTER — Ambulatory Visit: Payer: Self-pay | Admitting: Infectious Disease

## 2024-08-16 VITALS — Wt 189.0 lb

## 2024-08-16 DIAGNOSIS — Z87891 Personal history of nicotine dependence: Secondary | ICD-10-CM

## 2024-08-16 DIAGNOSIS — F410 Panic disorder [episodic paroxysmal anxiety] without agoraphobia: Secondary | ICD-10-CM

## 2024-08-16 DIAGNOSIS — Z79899 Other long term (current) drug therapy: Secondary | ICD-10-CM

## 2024-08-16 DIAGNOSIS — B20 Human immunodeficiency virus [HIV] disease: Secondary | ICD-10-CM | POA: Diagnosis not present

## 2024-08-16 DIAGNOSIS — F819 Developmental disorder of scholastic skills, unspecified: Secondary | ICD-10-CM

## 2024-08-16 DIAGNOSIS — F32A Depression, unspecified: Secondary | ICD-10-CM

## 2024-08-16 DIAGNOSIS — Z23 Encounter for immunization: Secondary | ICD-10-CM

## 2024-08-16 DIAGNOSIS — E785 Hyperlipidemia, unspecified: Secondary | ICD-10-CM

## 2024-08-16 DIAGNOSIS — F419 Anxiety disorder, unspecified: Secondary | ICD-10-CM

## 2024-08-16 DIAGNOSIS — R413 Other amnesia: Secondary | ICD-10-CM | POA: Insufficient documentation

## 2024-08-16 DIAGNOSIS — A609 Anogenital herpesviral infection, unspecified: Secondary | ICD-10-CM

## 2024-08-16 DIAGNOSIS — Z1231 Encounter for screening mammogram for malignant neoplasm of breast: Secondary | ICD-10-CM

## 2024-08-16 MED ORDER — DORAVIRINE 100 MG PO TABS
ORAL_TABLET | Freq: Every day | ORAL | 8 refills | Status: AC
  Filled 2024-08-16: qty 30, fill #0
  Filled 2024-08-16: qty 30, 30d supply, fill #0
  Filled 2024-09-07 – 2024-09-08 (×2): qty 30, 30d supply, fill #1
  Filled 2024-10-13 – 2024-10-16 (×2): qty 30, 30d supply, fill #2
  Filled 2024-11-10: qty 30, 30d supply, fill #3

## 2024-08-16 MED ORDER — ATORVASTATIN CALCIUM 10 MG PO TABS
10.0000 mg | ORAL_TABLET | Freq: Every day | ORAL | 11 refills | Status: AC
  Filled 2024-08-16: qty 30, 30d supply, fill #0

## 2024-08-16 MED ORDER — VALACYCLOVIR HCL 1 G PO TABS
1.0000 g | ORAL_TABLET | Freq: Two times a day (BID) | ORAL | 4 refills | Status: AC | PRN
  Filled 2024-08-16: qty 20, 10d supply, fill #0

## 2024-08-16 MED ORDER — DESCOVY 200-25 MG PO TABS
1.0000 | ORAL_TABLET | Freq: Every day | ORAL | 8 refills | Status: AC
  Filled 2024-08-16 (×2): qty 30, 30d supply, fill #0
  Filled 2024-09-07 – 2024-09-08 (×2): qty 30, 30d supply, fill #1
  Filled 2024-10-13 – 2024-10-16 (×2): qty 30, 30d supply, fill #2
  Filled 2024-11-10: qty 30, 30d supply, fill #3

## 2024-08-16 NOTE — Progress Notes (Addendum)
 Specialty Pharmacy Refill Coordination Note  Sue Mendez is a 58 y.o. female contacted today regarding refills of specialty medication(s) Doravirine  (PIFELTRO ); Emtricitabine -Tenofovir  AF (Descovy )   Patient requested Pickup at [facility] [place] date: 08/16/24   Medication will be filled on 08/16/24.

## 2024-08-28 ENCOUNTER — Other Ambulatory Visit (HOSPITAL_COMMUNITY): Payer: Self-pay

## 2024-09-07 ENCOUNTER — Other Ambulatory Visit (HOSPITAL_COMMUNITY): Payer: Self-pay

## 2024-09-08 ENCOUNTER — Other Ambulatory Visit: Payer: Self-pay

## 2024-09-12 ENCOUNTER — Other Ambulatory Visit: Payer: Self-pay

## 2024-09-12 NOTE — Progress Notes (Signed)
 Specialty Pharmacy Refill Coordination Note  Sue Mendez is a 58 y.o. female contacted today regarding refills of specialty medication(s) Doravirine  (PIFELTRO ); Emtricitabine -Tenofovir  AF (Descovy )   Patient requested Delivery   Delivery date: 09/22/24   Verified address: [address] [place] [address]   Medication will be filled on: 09/21/24

## 2024-09-21 ENCOUNTER — Other Ambulatory Visit: Payer: Self-pay

## 2024-09-27 NOTE — Progress Notes (Signed)
 The 10-year ASCVD risk score (Arnett DK, et al., 2019) is: 2.1%   Values used to calculate the score:     Age: 58 years     Clincally relevant sex: Female     Is Non-Hispanic African American: No     Diabetic: No     Tobacco smoker: No     Systolic Blood Pressure: 120 mmHg     Is BP treated: No     HDL Cholesterol: 51 mg/dL     Total Cholesterol: 163 mg/dL  Currently prescribed atorvastatin  10 mg.  Ivey Nembhard, BSN, RN

## 2024-10-13 ENCOUNTER — Other Ambulatory Visit (HOSPITAL_COMMUNITY): Payer: Self-pay

## 2024-10-16 ENCOUNTER — Other Ambulatory Visit (HOSPITAL_COMMUNITY): Payer: Self-pay

## 2024-10-16 ENCOUNTER — Other Ambulatory Visit: Payer: Self-pay

## 2024-10-16 NOTE — Progress Notes (Signed)
 Specialty Pharmacy Refill Coordination Note  Spoke with [name] is a 58 y.o. female contacted today regarding refills of specialty medication(s) Doravirine  (PIFELTRO ); Emtricitabine -Tenofovir  AF (Descovy )  Doses on hand: 8  Patient requested: Delivery   Delivery date: 10/20/24   Verified address: [address] [place] [address]  Medication will be filled on 10/19/24

## 2024-10-25 ENCOUNTER — Other Ambulatory Visit: Payer: Self-pay

## 2024-10-25 ENCOUNTER — Emergency Department: Admission: EM | Admit: 2024-10-25 | Discharge: 2024-10-25 | Disposition: A | Discharge status: Home / Self Care

## 2024-10-25 DIAGNOSIS — Z21 Asymptomatic human immunodeficiency virus [HIV] infection status: Secondary | ICD-10-CM | POA: Insufficient documentation

## 2024-10-25 DIAGNOSIS — J101 Influenza due to other identified influenza virus with other respiratory manifestations: Secondary | ICD-10-CM | POA: Insufficient documentation

## 2024-10-25 DIAGNOSIS — R509 Fever, unspecified: Secondary | ICD-10-CM | POA: Diagnosis present

## 2024-10-25 MED ORDER — KETOROLAC TROMETHAMINE 30 MG/ML IJ SOLN
30.0000 mg | Freq: Once | INTRAMUSCULAR | Status: AC
  Administered 2024-10-25: 30 mg via INTRAMUSCULAR
  Filled 2024-10-25: qty 1

## 2024-10-25 MED ORDER — ONDANSETRON 4 MG PO TBDP: 4.0000 mg | ORAL_TABLET | Freq: Three times a day (TID) | ORAL | 0 refills | Status: AC | PRN

## 2024-10-25 MED ORDER — FLUCONAZOLE 150 MG PO TABS: 150.0000 mg | ORAL_TABLET | Freq: Once | ORAL | 0 refills | Status: AC

## 2024-10-25 MED ORDER — PREDNISONE 20 MG PO TABS: 40.0000 mg | ORAL_TABLET | Freq: Every day | ORAL | 0 refills | Status: AC

## 2024-10-25 MED ORDER — ONDANSETRON 4 MG PO TBDP
4.0000 mg | ORAL_TABLET | Freq: Once | ORAL | Status: AC
  Administered 2024-10-25: 4 mg via ORAL
  Filled 2024-10-25: qty 1

## 2024-10-25 MED ORDER — ACETAMINOPHEN 500 MG PO TABS
1000.0000 mg | ORAL_TABLET | Freq: Once | ORAL | Status: AC
  Administered 2024-10-25: 1000 mg via ORAL
  Filled 2024-10-25: qty 2

## 2024-10-25 MED ORDER — BENZONATATE 100 MG PO CAPS: 200.0000 mg | ORAL_CAPSULE | Freq: Three times a day (TID) | ORAL | 0 refills | Status: AC | PRN

## 2024-10-25 MED ORDER — PSEUDOEPH-BROMPHEN-DM 30-2-10 MG/5ML PO SYRP: 5.0000 mL | ORAL_SOLUTION | Freq: Four times a day (QID) | ORAL | 0 refills | Status: AC | PRN

## 2024-10-25 MED ORDER — DEXTROMETHORPHAN POLISTIREX ER 30 MG/5ML PO SUER
30.0000 mg | Freq: Once | ORAL | Status: AC
  Administered 2024-10-25: 30 mg via ORAL
  Filled 2024-10-25: qty 5

## 2024-10-25 MED ORDER — ALBUTEROL SULFATE HFA 108 (90 BASE) MCG/ACT IN AERS: 2.0000 | INHALATION_SPRAY | Freq: Four times a day (QID) | RESPIRATORY_TRACT | 0 refills | Status: AC | PRN

## 2024-10-25 NOTE — ED Provider Notes (Signed)
 "   [facility] Emergency Department Provider Note     Event Date/Time   First MD Initiated Contact with Patient 10/25/24 2050     (approximate)   History   Fever   HPI  Sue Mendez is a 58 y.o. female with a history of HIV, HSV, HLD presents to the ED endorsing fever and headache for the last few days.  Patient reportedly tested positive for influenza A today after a positive home test.  She has been endorsing symptoms for the last 2 to 3 days however.  She presents to the ED in no acute distress.  She has been taking NyQuil, and some sporadic doses of Tylenol  in the interim for symptom relief.   Physical Exam   Triage Vital Signs: ED Triage Vitals  Encounter Vitals Group     BP 10/25/24 2021 124/83     Girls Systolic BP Percentile --      Girls Diastolic BP Percentile --      Boys Systolic BP Percentile --      Boys Diastolic BP Percentile --      Pulse Rate 10/25/24 2021 97     Resp 10/25/24 2021 20     Temp 10/25/24 2021 100 F (37.8 C)     Temp src --      SpO2 10/25/24 2021 100 %     Weight 10/25/24 2020 184 lb (83.5 kg)     Height 10/25/24 2020 5' 4 (1.626 m)     Head Circumference --      Peak Flow --      Pain Score 10/25/24 2020 8     Pain Loc --      Pain Education --      Exclude from Growth Chart --     Most recent vital signs: Vitals:   10/25/24 2021  BP: 124/83  Pulse: 97  Resp: 20  Temp: 100 F (37.8 C)  SpO2: 100%    General Awake, no distress.  NAD HEENT NCAT. PERRL. EOMI. No rhinorrhea. Mucous membranes are moist.  CV:  Good peripheral perfusion.  RRR RESP:  Normal effort.  CTA ABD:  No distention.    ED Results / Procedures / Treatments   Labs (all labs ordered are listed, but only abnormal results are displayed) Labs Reviewed - No data to display   EKG   RADIOLOGY   No results found.   PROCEDURES:  Critical Care performed: No  Procedures   MEDICATIONS ORDERED IN ED: Medications   ketorolac  (TORADOL ) 30 MG/ML injection 30 mg (has no administration in time range)  ondansetron  (ZOFRAN -ODT) disintegrating tablet 4 mg (has no administration in time range)  acetaminophen  (TYLENOL ) tablet 1,000 mg (has no administration in time range)  dextromethorphan  (DELSYM ) 30 MG/5ML liquid 30 mg (has no administration in time range)     IMPRESSION / MDM / ASSESSMENT AND PLAN / ED COURSE  I reviewed the triage vital signs and the nursing notes.                              Differential diagnosis includes, but is not limited to, influenza, viral illness, myalgias, sinusitis  Patient's presentation is most consistent with acute, uncomplicated illness.  Patient's diagnosis is consistent with influenza A, as previously diagnosed.  Patient presenting patient will be discharged home with prescriptions for albuterol  MDI, Tessalon  Perles, Bromfed-DM, Zofran , prednisone , and Diflucan . Patient is to follow up  with her primary provider as needed or otherwise directed. Patient is given ED precautions to return to the ED for any worsening or new symptoms.     FINAL CLINICAL IMPRESSION(S) / ED DIAGNOSES   Final diagnoses:  Influenza A     Rx / DC Orders   ED Discharge Orders          Ordered    predniSONE  (DELTASONE ) 20 MG tablet  Daily with breakfast        10/25/24 2057    ondansetron  (ZOFRAN -ODT) 4 MG disintegrating tablet  Every 8 hours PRN        10/25/24 2057    benzonatate  (TESSALON ) 100 MG capsule  3 times daily PRN        10/25/24 2057    brompheniramine-pseudoephedrine-DM 30-2-10 MG/5ML syrup  4 times daily PRN        10/25/24 2057    albuterol  (VENTOLIN  HFA) 108 (90 Base) MCG/ACT inhaler  Every 6 hours PRN        10/25/24 2057    fluconazole  (DIFLUCAN ) 150 MG tablet   Once        10/25/24 2058             Note:  This document was prepared using Dragon voice recognition software and may include unintentional dictation errors.    Loyd Candida LULLA Aldona,  PA-C 10/25/24 2128    Rexford Reche HERO, MD 10/25/24 2346  "

## 2024-10-25 NOTE — ED Triage Notes (Signed)
 Pt reports she has had a fever and headache for the past few days, pt tested +flu A

## 2024-10-25 NOTE — Discharge Instructions (Addendum)
 Take the prescription meds as directed.  Rest and hydrate as necessary.  Follow-up with your primary provider for ongoing evaluation.

## 2024-10-30 ENCOUNTER — Other Ambulatory Visit

## 2024-11-10 ENCOUNTER — Other Ambulatory Visit: Payer: Self-pay

## 2024-11-13 ENCOUNTER — Ambulatory Visit: Admitting: Infectious Disease

## 2024-11-14 ENCOUNTER — Other Ambulatory Visit (HOSPITAL_COMMUNITY): Payer: Self-pay

## 2024-11-14 NOTE — Progress Notes (Signed)
 Specialty Pharmacy Refill Coordination Note  Sue Mendez is a 59 y.o. female contacted today regarding refills of specialty medication(s) Doravirine  (PIFELTRO ); Emtricitabine -Tenofovir  AF (Descovy )   Patient requested Delivery   Delivery date: 11/27/24   Verified address: [address] [place] [address]   Medication will be filled on: 11/24/24

## 2024-11-20 ENCOUNTER — Ambulatory Visit: Admitting: Infectious Disease

## 2024-11-21 ENCOUNTER — Other Ambulatory Visit: Payer: Self-pay

## 2024-11-21 NOTE — Progress Notes (Signed)
 Patient was contacted by the pharmacy regarding their specialty medication(s) Doravirine  (PIFELTRO ); Emtricitabine -Tenofovir  AF (Descovy ), to reschedule their delivery due to impending cold weather conditions.  Medications will be filled on 11/28/24 and scheduled for delivery by 11/29/24 per patients request.

## 2024-11-22 ENCOUNTER — Other Ambulatory Visit: Payer: Self-pay

## 2024-11-28 ENCOUNTER — Other Ambulatory Visit: Payer: Self-pay

## 2025-01-01 ENCOUNTER — Ambulatory Visit: Admitting: Infectious Disease

## 2025-01-17 ENCOUNTER — Ambulatory Visit: Admitting: Diagnostic Neuroimaging
